# Patient Record
Sex: Female | Born: 1990 | Race: Black or African American | Hispanic: No | Marital: Single | Smoking: Never smoker
Health system: Southern US, Community
[De-identification: ages and names within clinical notes are randomized; demographics above are authoritative.]

## PROBLEM LIST (undated history)

## (undated) DIAGNOSIS — Z789 Other specified health status: Secondary | ICD-10-CM

## (undated) DIAGNOSIS — J939 Pneumothorax, unspecified: Secondary | ICD-10-CM

## (undated) HISTORY — PX: NO PAST SURGERIES: SHX2092

---
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
## 2008-06-30 ENCOUNTER — Emergency Department (HOSPITAL_COMMUNITY): Admission: EM | Admit: 2008-06-30 | Discharge: 2008-07-01 | Payer: Self-pay | Admitting: Emergency Medicine

## 2009-01-07 ENCOUNTER — Emergency Department (HOSPITAL_COMMUNITY): Admission: EM | Admit: 2009-01-07 | Discharge: 2009-01-07 | Payer: Self-pay | Admitting: Emergency Medicine

## 2009-03-04 ENCOUNTER — Ambulatory Visit: Payer: Self-pay | Admitting: Internal Medicine

## 2009-03-04 ENCOUNTER — Inpatient Hospital Stay (HOSPITAL_COMMUNITY): Admission: EM | Admit: 2009-03-04 | Discharge: 2009-03-06 | Payer: Self-pay | Admitting: Emergency Medicine

## 2009-07-12 ENCOUNTER — Emergency Department (HOSPITAL_COMMUNITY): Admission: EM | Admit: 2009-07-12 | Discharge: 2009-07-12 | Payer: Self-pay | Admitting: Emergency Medicine

## 2009-12-07 ENCOUNTER — Emergency Department (HOSPITAL_COMMUNITY): Admission: EM | Admit: 2009-12-07 | Discharge: 2009-12-07 | Payer: Self-pay | Admitting: Emergency Medicine

## 2010-01-07 ENCOUNTER — Emergency Department (HOSPITAL_COMMUNITY): Admission: EM | Admit: 2010-01-07 | Discharge: 2010-01-07 | Payer: Self-pay | Admitting: Emergency Medicine

## 2010-04-24 ENCOUNTER — Emergency Department (HOSPITAL_COMMUNITY): Admission: EM | Admit: 2010-04-24 | Discharge: 2010-04-25 | Payer: Self-pay | Admitting: Emergency Medicine

## 2010-10-05 ENCOUNTER — Emergency Department (HOSPITAL_COMMUNITY)
Admission: EM | Admit: 2010-10-05 | Discharge: 2010-10-05 | Payer: Self-pay | Source: Home / Self Care | Admitting: Emergency Medicine

## 2010-10-13 ENCOUNTER — Emergency Department (HOSPITAL_COMMUNITY)
Admission: EM | Admit: 2010-10-13 | Discharge: 2010-10-13 | Payer: Self-pay | Source: Home / Self Care | Admitting: Emergency Medicine

## 2010-11-06 ENCOUNTER — Encounter: Payer: Self-pay | Admitting: Family Medicine

## 2010-12-26 ENCOUNTER — Emergency Department (HOSPITAL_COMMUNITY)
Admission: EM | Admit: 2010-12-26 | Discharge: 2010-12-26 | Disposition: A | Discharge status: Home / Self Care | Payer: Self-pay | Attending: Emergency Medicine | Admitting: Emergency Medicine

## 2010-12-26 ENCOUNTER — Emergency Department (HOSPITAL_COMMUNITY): Payer: Self-pay

## 2010-12-26 DIAGNOSIS — R079 Chest pain, unspecified: Secondary | ICD-10-CM | POA: Insufficient documentation

## 2010-12-26 DIAGNOSIS — R112 Nausea with vomiting, unspecified: Secondary | ICD-10-CM | POA: Insufficient documentation

## 2010-12-26 DIAGNOSIS — R5381 Other malaise: Secondary | ICD-10-CM | POA: Insufficient documentation

## 2010-12-26 DIAGNOSIS — R109 Unspecified abdominal pain: Secondary | ICD-10-CM | POA: Insufficient documentation

## 2010-12-26 DIAGNOSIS — E860 Dehydration: Secondary | ICD-10-CM | POA: Insufficient documentation

## 2010-12-26 LAB — URINALYSIS, ROUTINE W REFLEX MICROSCOPIC
Glucose, UA: NEGATIVE mg/dL
Ketones, ur: >80 mg/dL — AB
Nitrite: NEGATIVE
Protein, ur: 100 mg/dL — AB
Specific Gravity, Urine: 1.038 — ABNORMAL HIGH (ref 1.005–1.030)
Urobilinogen, UA: 1 mg/dL (ref 0.0–1.0)
pH: 6 (ref 5.0–8.0)

## 2010-12-26 LAB — DIFFERENTIAL
Basophils Absolute: 0 10*3/uL (ref 0.0–0.1)
Basophils Relative: 0 % (ref 0–1)
Eosinophils Absolute: 0 10*3/uL (ref 0.0–0.7)
Eosinophils Relative: 0 % (ref 0–5)
Lymphocytes Relative: 21 % (ref 12–46)
Lymphs Abs: 1.4 10*3/uL (ref 0.7–4.0)
Monocytes Absolute: 0.5 10*3/uL (ref 0.1–1.0)
Monocytes Relative: 7 % (ref 3–12)
Neutro Abs: 5 10*3/uL (ref 1.7–7.7)
Neutrophils Relative %: 72 % (ref 43–77)

## 2010-12-26 LAB — COMPREHENSIVE METABOLIC PANEL
ALT: 15 U/L (ref 0–35)
AST: 26 U/L (ref 0–37)
Albumin: 4 g/dL (ref 3.5–5.2)
Alkaline Phosphatase: 41 U/L (ref 39–117)
BUN: 21 mg/dL (ref 6–23)
CO2: 25 mEq/L (ref 19–32)
Calcium: 8.6 mg/dL (ref 8.4–10.5)
Chloride: 108 mEq/L (ref 96–112)
Creatinine, Ser: 0.89 mg/dL (ref 0.4–1.2)
GFR calc Af Amer: >60 mL/min (ref >60)
GFR calc non Af Amer: >60 mL/min (ref >60)
Glucose, Bld: 87 mg/dL (ref 70–99)
Potassium: 3.6 mEq/L (ref 3.5–5.1)
Sodium: 140 mEq/L (ref 135–145)
Total Bilirubin: 1.1 mg/dL (ref 0.3–1.2)
Total Protein: 6.7 g/dL (ref 6.0–8.3)

## 2010-12-26 LAB — LIPASE, BLOOD: Lipase: 23 U/L (ref 11–59)

## 2010-12-26 LAB — CBC
HCT: 40.4 % (ref 36.0–46.0)
Hemoglobin: 13.3 g/dL (ref 12.0–15.0)
MCH: 30.8 pg (ref 26.0–34.0)
MCHC: 32.9 g/dL (ref 30.0–36.0)
MCV: 93.5 fL (ref 78.0–100.0)
Platelets: 229 10*3/uL (ref 150–400)
RBC: 4.32 MIL/uL (ref 3.87–5.11)
RDW: 13.1 % (ref 11.5–15.5)
WBC: 6.9 10*3/uL (ref 4.0–10.5)

## 2010-12-26 LAB — POCT PREGNANCY, URINE: Preg Test, Ur: NEGATIVE

## 2010-12-26 LAB — URINE MICROSCOPIC-ADD ON

## 2010-12-27 LAB — URINALYSIS, ROUTINE W REFLEX MICROSCOPIC
Bilirubin Urine: NEGATIVE
Glucose, UA: NEGATIVE mg/dL
Glucose, UA: NEGATIVE mg/dL
Ketones, ur: NEGATIVE mg/dL
Ketones, ur: 40 mg/dL — AB
Leukocytes, UA: NEGATIVE
Nitrite: NEGATIVE
Protein, ur: NEGATIVE mg/dL
Specific Gravity, Urine: 1.018 (ref 1.005–1.030)
Urobilinogen, UA: 0.2 mg/dL (ref 0.0–1.0)
pH: 8 (ref 5.0–8.0)
pH: 5 (ref 5.0–8.0)

## 2010-12-27 LAB — POCT PREGNANCY, URINE
Preg Test, Ur: NEGATIVE
Preg Test, Ur: NEGATIVE

## 2010-12-27 LAB — BASIC METABOLIC PANEL
BUN: 19 mg/dL (ref 6–23)
CO2: 23 mEq/L (ref 19–32)
Chloride: 105 mEq/L (ref 96–112)
Creatinine, Ser: 0.9 mg/dL (ref 0.4–1.2)

## 2010-12-27 LAB — URINE MICROSCOPIC-ADD ON

## 2010-12-27 LAB — CBC
MCH: 31.9 pg (ref 26.0–34.0)
MCV: 90.9 fL (ref 78.0–100.0)
Platelets: 314 10*3/uL (ref 150–400)
RDW: 12.3 % (ref 11.5–15.5)

## 2010-12-27 LAB — WET PREP, GENITAL: Yeast Wet Prep HPF POC: NONE SEEN

## 2010-12-27 LAB — DIFFERENTIAL
Basophils Absolute: 0 10*3/uL (ref 0.0–0.1)
Eosinophils Absolute: 0 10*3/uL (ref 0.0–0.7)
Eosinophils Relative: 0 % (ref 0–5)
Lymphs Abs: 1.4 10*3/uL (ref 0.7–4.0)

## 2010-12-27 LAB — URINE CULTURE

## 2010-12-27 LAB — GC/CHLAMYDIA PROBE AMP, GENITAL: GC Probe Amp, Genital: NEGATIVE

## 2011-01-02 LAB — BASIC METABOLIC PANEL
GFR calc Af Amer: >60 mL/min (ref >60)
GFR calc non Af Amer: >60 mL/min (ref >60)
Glucose, Bld: 144 mg/dL — ABNORMAL HIGH (ref 70–99)
Potassium: 3.6 mEq/L (ref 3.5–5.1)
Sodium: 139 mEq/L (ref 135–145)

## 2011-01-02 LAB — URINE MICROSCOPIC-ADD ON

## 2011-01-02 LAB — URINALYSIS, ROUTINE W REFLEX MICROSCOPIC
Bilirubin Urine: NEGATIVE
Glucose, UA: NEGATIVE mg/dL
Ketones, ur: 40 mg/dL — AB
Protein, ur: 100 mg/dL — AB

## 2011-01-02 LAB — DIFFERENTIAL
Lymphocytes Relative: 5 % — ABNORMAL LOW (ref 12–46)
Lymphs Abs: 0.6 10*3/uL — ABNORMAL LOW (ref 0.7–4.0)
Monocytes Absolute: 0.1 10*3/uL (ref 0.1–1.0)
Monocytes Relative: 1 % — ABNORMAL LOW (ref 3–12)
Neutro Abs: 12.6 10*3/uL — ABNORMAL HIGH (ref 1.7–7.7)
Neutrophils Relative %: 94 % — ABNORMAL HIGH (ref 43–77)

## 2011-01-02 LAB — CBC
HCT: 44 % (ref 36.0–46.0)
Hemoglobin: 14.6 g/dL (ref 12.0–15.0)
MCHC: 33.1 g/dL (ref 30.0–36.0)
RBC: 4.63 MIL/uL (ref 3.87–5.11)
WBC: 13.4 10*3/uL — ABNORMAL HIGH (ref 4.0–10.5)

## 2011-01-09 LAB — URINALYSIS, ROUTINE W REFLEX MICROSCOPIC
Glucose, UA: NEGATIVE mg/dL
Specific Gravity, Urine: >1.03 — ABNORMAL HIGH (ref 1.005–1.030)

## 2011-01-09 LAB — POCT I-STAT, CHEM 8
BUN: 21 mg/dL (ref 6–23)
Calcium, Ion: 0.86 mmol/L — ABNORMAL LOW (ref 1.12–1.32)
Glucose, Bld: 104 mg/dL — ABNORMAL HIGH (ref 70–99)
HCT: 40 % (ref 36.0–46.0)
TCO2: 13 mmol/L (ref 0–100)

## 2011-01-09 LAB — POCT PREGNANCY, URINE: Preg Test, Ur: NEGATIVE

## 2011-01-09 LAB — URINE MICROSCOPIC-ADD ON

## 2011-01-21 LAB — URINALYSIS, ROUTINE W REFLEX MICROSCOPIC
Hgb urine dipstick: NEGATIVE
Ketones, ur: 40 mg/dL — AB
Nitrite: NEGATIVE
Specific Gravity, Urine: 1.031 — ABNORMAL HIGH (ref 1.005–1.030)
pH: 7 (ref 5.0–8.0)

## 2011-01-21 LAB — POCT PREGNANCY, URINE: Preg Test, Ur: NEGATIVE

## 2011-01-21 LAB — URINE MICROSCOPIC-ADD ON

## 2011-01-25 LAB — BASIC METABOLIC PANEL
BUN: 4 mg/dL — ABNORMAL LOW (ref 6–23)
CO2: 24 mEq/L (ref 19–32)
Calcium: 8.7 mg/dL (ref 8.4–10.5)
Creatinine, Ser: 0.78 mg/dL (ref 0.4–1.2)
GFR calc Af Amer: >60 mL/min (ref >60)
GFR calc non Af Amer: >60 mL/min (ref >60)
Potassium: 4.1 mEq/L (ref 3.5–5.1)

## 2011-01-25 LAB — CBC
HCT: 32.1 % — ABNORMAL LOW (ref 36.0–46.0)
MCHC: 32.7 g/dL (ref 30.0–36.0)
MCHC: 34 g/dL (ref 30.0–36.0)
Platelets: 232 10*3/uL (ref 150–400)
RBC: 3.95 MIL/uL (ref 3.87–5.11)
RBC: 4.88 MIL/uL (ref 3.87–5.11)
WBC: 7.3 10*3/uL (ref 4.0–10.5)
WBC: 9.3 10*3/uL (ref 4.0–10.5)

## 2011-01-25 LAB — URINE MICROSCOPIC-ADD ON

## 2011-01-25 LAB — COMPREHENSIVE METABOLIC PANEL
ALT: 20 U/L (ref 0–35)
AST: 29 U/L (ref 0–37)
Alkaline Phosphatase: 70 U/L (ref 39–117)
CO2: 25 mEq/L (ref 19–32)
Calcium: 10.7 mg/dL — ABNORMAL HIGH (ref 8.4–10.5)
GFR calc Af Amer: >60 mL/min (ref >60)
GFR calc non Af Amer: >60 mL/min (ref >60)
Potassium: 3 mEq/L — ABNORMAL LOW (ref 3.5–5.1)
Sodium: 139 mEq/L (ref 135–145)
Total Protein: 8.3 g/dL (ref 6.0–8.3)

## 2011-01-25 LAB — URINALYSIS, ROUTINE W REFLEX MICROSCOPIC
Specific Gravity, Urine: 1.034 — ABNORMAL HIGH (ref 1.005–1.030)
pH: 6.5 (ref 5.0–8.0)

## 2011-01-25 LAB — TSH: TSH: 3.253 u[IU]/mL (ref 0.350–4.500)

## 2011-01-25 LAB — DIFFERENTIAL
Eosinophils Absolute: 0 10*3/uL (ref 0.0–0.7)
Eosinophils Relative: 0 % (ref 0–5)
Lymphs Abs: 1.7 10*3/uL (ref 0.7–4.0)
Monocytes Relative: 5 % (ref 3–12)

## 2011-01-25 LAB — MAGNESIUM: Magnesium: 2.4 mg/dL (ref 1.5–2.5)

## 2011-01-27 LAB — COMPREHENSIVE METABOLIC PANEL
ALT: 29 U/L (ref 0–35)
Alkaline Phosphatase: 70 U/L (ref 39–117)
CO2: 19 mEq/L (ref 19–32)
Chloride: 103 mEq/L (ref 96–112)
GFR calc non Af Amer: >60 mL/min (ref >60)
Glucose, Bld: 92 mg/dL (ref 70–99)
Potassium: 3.1 mEq/L — ABNORMAL LOW (ref 3.5–5.1)
Sodium: 138 mEq/L (ref 135–145)
Total Bilirubin: 2.6 mg/dL — ABNORMAL HIGH (ref 0.3–1.2)

## 2011-01-27 LAB — WET PREP, GENITAL
Trich, Wet Prep: NONE SEEN
Yeast Wet Prep HPF POC: NONE SEEN

## 2011-01-27 LAB — URINALYSIS, ROUTINE W REFLEX MICROSCOPIC
Ketones, ur: >80 mg/dL — AB
Nitrite: NEGATIVE
Protein, ur: 100 mg/dL — AB

## 2011-01-27 LAB — DIFFERENTIAL
Basophils Relative: 0 % (ref 0–1)
Eosinophils Absolute: 0 10*3/uL (ref 0.0–0.7)
Neutrophils Relative %: 86 % — ABNORMAL HIGH (ref 43–77)

## 2011-01-27 LAB — URINE MICROSCOPIC-ADD ON

## 2011-01-27 LAB — POCT PREGNANCY, URINE: Preg Test, Ur: NEGATIVE

## 2011-01-27 LAB — LIPASE, BLOOD: Lipase: 18 U/L (ref 11–59)

## 2011-01-27 LAB — CBC
Hemoglobin: 14.8 g/dL (ref 12.0–15.0)
MCHC: 33.8 g/dL (ref 30.0–36.0)
RBC: 4.64 MIL/uL (ref 3.87–5.11)
WBC: 6.5 10*3/uL (ref 4.0–10.5)

## 2011-01-27 LAB — GC/CHLAMYDIA PROBE AMP, GENITAL: Chlamydia, DNA Probe: NEGATIVE

## 2011-02-17 ENCOUNTER — Emergency Department (HOSPITAL_COMMUNITY)
Admission: EM | Admit: 2011-02-17 | Discharge: 2011-02-17 | Disposition: A | Discharge status: Home / Self Care | Payer: Self-pay | Attending: Emergency Medicine | Admitting: Emergency Medicine

## 2011-02-17 DIAGNOSIS — R112 Nausea with vomiting, unspecified: Secondary | ICD-10-CM | POA: Insufficient documentation

## 2011-02-17 DIAGNOSIS — R109 Unspecified abdominal pain: Secondary | ICD-10-CM | POA: Insufficient documentation

## 2011-02-17 DIAGNOSIS — E860 Dehydration: Secondary | ICD-10-CM | POA: Insufficient documentation

## 2011-02-17 LAB — URINE MICROSCOPIC-ADD ON

## 2011-02-17 LAB — URINALYSIS, ROUTINE W REFLEX MICROSCOPIC
Glucose, UA: NEGATIVE mg/dL
Specific Gravity, Urine: 1.036 — ABNORMAL HIGH (ref 1.005–1.030)
Urobilinogen, UA: 1 mg/dL (ref 0.0–1.0)

## 2011-03-01 NOTE — Discharge Summary (Signed)
NAMESAIMA, [name]            ACCOUNT NO.:  [id number]   MEDICAL RECORD NO.:  [id number]          PATIENT TYPE:  INP   LOCATION:  1518                         FACILITY:  [facility]   PHYSICIAN:  Elliot Cousin, M.D.    DATE OF BIRTH:  07/24/1991   DATE OF ADMISSION:  03/04/2009  DATE OF DISCHARGE:  03/06/2009                               DISCHARGE SUMMARY   DISCHARGE DIAGNOSES:  1. Moderate pneumomediastinum.  The etiology was felt to be      spontaneous as a consequence of persistent intractable vomiting      associated with Valsalva.  2. Associated tiny left apex pneumothorax, completely resolved prior      to hospital discharge.  3. Severe dysmenorrhea with nausea, vomiting, and diarrhea associated      with menstrual periods.  4. Hypokalemia.  5. Hematuria associated with menstrual blood loss.  6. Facial angioedema though to be secondary to vancomycin.   DISCHARGE MEDICATIONS:  1. Vicodin 5 mg every six hours as needed for pain.  2. Phenergan 12.5 mg every six hours as needed for nausea.   DISCHARGE DISPOSITION:  The patient is being discharged to home in  improved and stable condition.  She was advised to follow up at the  [facility] to see her new primary care physician, Dr. Audria Nine  on April 07, 2009 at 2:30 p.m..   CONSULTATIONS:  Sandrea Hughs, MD.   PROCEDURES PERFORMED:  1. Chest x-ray in Mar 06, 2009.  The results revealed decreased      pneumomediastinum.  2. Chest x-ray on Mar 05, 2009.  The results revealed no change in      pneumomediastinum.  3. CT angiogram of the chest on Mar 05, 2009.  Results revealed no      evidence of acute pulmonary embolism.  Moderate pneumomediastinum,      slightly progressive compared with chest radiograph yesterday.  No      pneumothorax.  4. Chest x-ray on Mar 04, 2009.  The results revealed      pneumomediastinum with tiny left apex pneumothorax.   HISTORY OF PRESENT ILLNESS:  The patient is a 20 year old woman with  a  past medical history significant for severe dysmenorrhea, who presented  to the emergency department on Mar 04, 2009 with a chief complaint of  sharp substernal chest pain.  The patient actually presented to the  emergency department a few days prior because of the same symptoms.  When she was evaluated in the emergency department on May 19, she was  noted to be afebrile and hemodynamically stable.  A chest x-ray was  ordered and it revealed pneumomediastinum with a tiny left apex  pneumothorax.  Of note, the patient gave a history of abdominal pain,  nausea, vomiting and diarrhea several days prior to the onset of her  chest pain.  According to the patient, she had these recurrent symptoms  monthly associated with her menstrual periods.  Given the finding of the  pneumomediastinum, she was admitted for further evaluation and  management.   For further details, please see the dictated history and physical.  HOSPITAL COURSE:  1. PNEUMOMEDIASTINUM AND TINY PNEUMOTHORAX.  A CT scan of the chest      was ordered for further evaluation.  The CT angiogram revealed      moderate pneumomediastinum, no evidence of acute pulmonary      embolism, and no pneumothorax.  The patient was admitted to the      intensive care unit for closer observation.  Oxygen therapy was      administered.  Her pain was treated with as-needed Dilaudid.      Apparently the patient was given multiple antibiotics in the      emergency department including Zithromax, Unasyn, and vancomycin.      She was subsequently started on Avelox.  It was unclear why these      antibiotics were ordered as the patient had no evidence of fever or      infection.  They were discontinued.  Pulmonologist Dr. Sherene Sires was      consulted for his assessment.  He commented that it was not unusual      for young individuals to experience a spontaneous pneumothorax or      pneumomediastinum, particularly if it is associated with Valsalva-       like actions such as retching and vomiting as the patient      experienced.  He concurred with oxygen therapy and recommended      adding as needed nebulizers for symptomatic relief.  He concurred      as well with obtaining serial chest x-rays until there was both      radiographic and clinical improvement.  The follow-up chest x-ray      on May 20 revealed no change in the pneumomediastinum.  However,      the chest x-ray today revealed a decrease in the pneumomediastinum.      There was no evidence of a pneumothorax.  The patient is      symptomatically improved and she has no complaints of shortness of      breath or chest pain.  2. SEVERE DYSMENORRHEA (nausea, vomiting, abdominal pain and diarrhea      associated with menstrual periods).  The patient's symptoms had      mostly resolved at the time of the initial hospital assessment.      However, because it was felt that her recurrent symptoms were the      impetus for the pneumomediastinum, she was strongly advised to      follow up with either the [facility] or [facility] for ongoing management.  An appointment has      been made for her to follow up with Dr. Audria Nine at the      [facility] in a few weeks.  Of note, a urinalysis was      ordered and it revealed large blood, 100 protein, negative      nitrites, and negative leukocytes.  The blood seen in the urine was      felt to be secondary to menstrual blood.  The patient had no      complaints of painful urination.  3. NORMOCYTIC ANEMIA.  The patient's hemoglobin fell to 10.8.  This      was felt to be secondary to menstrual bleeding.  Her white blood      cell count at the time of the initial hospital assessment was      elevated at 15.1 which was  thought to be due to hemoconcentration;      it normalized.  4. HYPOKALEMIA.  The patient's serum potassium was 3 at the time of      the initial hospital assessment.  She was  repleted with potassium      chloride orally and in the IV fluids.  Blood magnesium was assessed      and was within normal limits at 2.4.  Today, her serum potassium is      4.1.  5. ALLERGIC REACTION TO VANCOMYCIN SUSPECTED.  The patient apparently      developed facial angioedema shortly after the vancomycin infusion.      It was stopped and she was treated with Benadryl.  Her symptoms      completely resolved.  Her symptoms were likely secondary to      vancomycin although the patient did receive multiple antibiotics in      the emergency department which were all subsequently discontinued.      The patient was informed of her potential allergy to vancomycin.      She voiced understanding.   Additional lab studies were ordered including a TSH which was within  normal limits at 3.253 and a urine pregnancy test which was negative.      Elliot Cousin, M.D.  Electronically Signed     DF/MEDQ  D:  03/06/2009  T:  03/06/2009  Job:  [id number]   cc:   Maurice March, M.D.  Fax: [phone]

## 2011-03-01 NOTE — Discharge Summary (Signed)
[name], [name]            ACCOUNT NO.:  [id number]   MEDICAL RECORD NO.:  [id number]          PATIENT TYPE:  INP   LOCATION:  6121                         FACILITY:  [facility]   PHYSICIAN:  Orie Rout, M.D.DATE OF BIRTH:  11-14-1990   DATE OF ADMISSION:  04/16/2007  DATE OF DISCHARGE:  04/21/2007                               DISCHARGE SUMMARY   REASON FOR HOSPITALIZATION:  Dysmenorrhea, continued emesis in a 20-year-  old African-American female who denies any sexual activity.   SIGNIFICANT FINDINGS:  Admission labs consisted of a normal complete  metabolic panel.  CBC showed white blood cells of 11, hemoglobin of  14.1, hematocrit of 42, and platelets of 310, with a differential of 91%  neutrophils.  Urinalysis was significant only for greater than 80  ketones and positive granular casts.  Urine pregnancy test was negative.  Wet prep was positive for a few clue cells, negative for yeast, negative  for trichomonads, negative for white blood cells.  Ultrasound of the  abdomen and pelvis were normal showing no free fluid.  Of note, the  patient's GC chlamydia test were positive for GC.   On exam in the emergency department, the patient showed some mild  cervical motion tenderness on exam.  Also during this admission, the  patient had an EKG performed which revealed a prolonged QTC which was  approximately 0.5 seconds.   TREATMENT:  The patient received ceftriaxone intramuscularly x1 in the  emergency department to treat cervicitis that was noted on exam in the  emergency department.  Was also started on doxycycline.  Supportive  treatment with IV maintenance IV fluids was begun along with antiemetics  for nausea.  As previously stated, doxycycline 100 mg twice a day was  started in the ED for a total of a 27-dose course.  The date of this  start was April 17, 2007.   OPERATION/PROCEDURE:  None.   FINAL DIAGNOSES:  1. Dysmenorrhea.  2. Premenstrual dysphoric  disorder.  3. Gonorrhea infection.  4. Pelvic inflammatory disease.  5. Long QT syndrome.  6. Persistent emesis.   DISCHARGE MEDICATIONS AND INSTRUCTIONS:  1. Doxycycline 100 mg p.o. b.i.d. x20 doses to complete a 27-dose      course.  2. Phenergan 12.5 mg p.o. every four to six hours p.r.n. nausea and      vomiting.  3. Oral contraceptive pill Loestrin 24 Fe 1 mg/20 mcg p.o. once daily.   The patient is to return for temperature greater than 100.4, or if the  nausea and vomiting becomes worse.   PENDING RESULTS AND ISSUES TO BE FOLLOWED:  Long QT syndrome;  premenstrual dysphoric disorder.   FOLLOWUP:  Follow up is with Dr. Johney Maine, phone number [phone].  Scheduled date for follow up is April 26, 2007 at 3 p.m.  The patient is  also to follow up with Dr. Ace Gins with cardiology on May 10, 2007 at 9:30  a.m.   Discharge weight 45 kilograms.   Discharge condition improved.   Please fax a copy of this transcription to Dr. Johney Maine with [facility].  Fax  number is [phone].  Also, please fax a copy  of this transcription to Dr. Ace Gins with cardiology.  Fax number is [phone].      Myrtie Soman, MD  Electronically Signed      Orie Rout, M.D.  Electronically Signed    TE/MEDQ  D:  04/21/2007  T:  04/21/2007  Job:  [id number]

## 2011-03-01 NOTE — H&P (Signed)
[name], [name]            ACCOUNT NO.:  [id number]   MEDICAL RECORD NO.:  [id number]          PATIENT TYPE:  INP   LOCATION:  1223                         FACILITY:  [facility]   PHYSICIAN:  Richarda Overlie, MD       DATE OF BIRTH:  [birth year]   DATE OF ADMISSION:  03/04/2009  DATE OF DISCHARGE:                              HISTORY & PHYSICAL   PRIMARY CARE PHYSICIAN:  Unassigned.   CHIEF COMPLAINT:  Chest pain.   SUBJECTIVE:  This is a 20 year old female who presented to the ER with  chief complaint of chest discomfort that started about 4 days ago,  fairly acute in onset.  The chest pain is described as episodic, worse  with exertion.  The patient describes the chest pain to be substernal in  location and sharp in nature, not pleuritic.  Worse when she coughs.  The patient has been having some URI like symptoms with nasal  congestion, sore throat and a cough for the last 7-10 days.  She denies  any fever, chills or rigors.  The patient has not had any treatment thus  far for her cardiopulmonary symptoms.  She denies any history of  smoking.   PAST MEDICAL HISTORY:  None.   PAST SURGICAL HISTORY:  None.   SOCIAL HISTORY:  Nonsmoker and nonalcoholic.  No history of drug abuse.   ALLERGIES:  NO KNOWN DRUG ALLERGIES.   HOME MEDICATIONS:  None.   REVIEW OF SYSTEMS:  As documented in HPI.   PHYSICAL EXAMINATION:  VITAL SIGNS:  Blood pressure 154/80, pulse of 82,  respirations 20, temperature 97.7.  GENERAL:  The patient appears to be currently comfortable in no acute  distress.  HEENT:  Pupils equal and reactive.  Extraocular movements intact.  Sclerae is anicteric.  Head is atraumatic, normocephalic.  LUNGS:  Bilateral breath sounds are equal.  No rhonchi, no wheezing, no  rubs.  CARDIOVASCULAR:  Regular rate and rhythm.  No appreciable  murmurs, rubs or gallops.  ABDOMEN:  Soft, nontender, nondistended.  EXTREMITIES:  Without cyanosis, clubbing or edema.  NEUROLOGIC:   Cranial nerves II-XII appear to be grossly intact.  The  patient is alert and oriented x3.  SKIN:  No rash, no petechiae.   LABORATORY DATA:  EKG shows sinus arrhythmia with premature ventricular  contractions with a rate of 64 beats per minute, a normal axis, a  prolonged QT.   Chest.  A pneumomediastinum is seen with tiny left apex, pneumothorax  with tiny left apical pneumothorax.  Urinalysis shows a cloudy  appearance with a specific gravity of 1.034.  Hemoglobin, large amount  of hemoglobin, small amount of ketones at 15.  Negative for nitrite and  leukocyte esterase.  Pregnancy test is negative.  Lipase is 19.  Comprehensive metabolic panel shows a sodium of 139, potassium 3.0,  chloride 102, bicarb 25, glucose 116, BUN 20, creatinine 0.89, calcium  10.7.  AST, ALT, alk phos, total bili within normal limits.  WBC 8.8,  hemoglobin 15.1, hematocrit 46.0, MCV 94.3, platelet count of 336.  Differential, no left shift.   ASSESSMENT AND PLAN:  Pneumomediastinum, likely spontaneous because of  recent upper respiratory infection like symptoms.   I discussed the case with Dr. Maurice March and Dr. Shelle Iron.  Dr. Maurice March is a  pulmonary critical care specialist on-call tonight and Dr. Shelle Iron is a  pulmonologist on-call tonight and the hospitalist service has been  instructed to admit this patient to the inpatient medical service.  We  will monitor the patient in the step-down unit.  Continue her or  humidified oxygen, start her on nebulizer treatments and Pulmicort nebs.  Replete her potassium, repeat a chest x-ray PA and lateral in the  morning.  We will probably get a thoracic surgery consult in the  morning.  Pain control will be optimized with p.r.n. Dilaudid.  The  patient will be started empirically on Avelox and vancomycin for broad-  spectrum coverage, Robitussin with codeine for cough.  She is a full  code.      Richarda Overlie, MD  Electronically Signed     NA/MEDQ  D:  03/04/2009  T:   03/05/2009  Job:  [id number]

## 2011-03-04 NOTE — Discharge Summary (Signed)
[name], [name]            ACCOUNT NO.:  [id number]   MEDICAL RECORD NO.:  [id number]          PATIENT TYPE:  OBV   LOCATION:  6150                         FACILITY:  [facility]   PHYSICIAN:  Pediatrics Resident    DATE OF BIRTH:  27-Jun-1991   DATE OF ADMISSION:  12/26/2006  DATE OF DISCHARGE:  12/28/2006                               DISCHARGE SUMMARY   REASON FOR HOSPITALIZATION:  Vomiting, diarrhea, and chest pain.   SIGNIFICANT FINDINGS:  This is a 20 year old female with nausea,  vomiting, and diarrhea who was noted to have mild dehydration on  admission.  The vomiting occurs with her menstrual cycle.  Recent pelvic  examination done at a prior ED visit showed negative GC/Chlamydia and  normal pelvic examination.   PERTINENT LABORATORIES:  Normal sodium 136, normal potassium 3.7, normal  chloride 101, normal bicarbonate 22, normal BUN 23, normal creatinine  0.7, glucose of 100.  GGT normal 23, calcium 9.8, phosphorous is low at  1.6, magnesium 2.2, CRP 0, ESR 5, calcium 9.8.  UDS:  Urine drug screen  positive for marijuana.  Flu negative.  Lipase 28, amylase slightly  elevated at 136, AST normal 38, ALT normal 16.  Urine pregnancy  negative.  Urinalysis showed specific gravity of 1.034, small bilirubin,  40 ketones, large amount of blood, 100 of protein, and small leukocytes.  Chest x-ray was within normal limits, and EKG showed a prolonged QT and  QTC at 0.5.  Ultrasound of the abdomen was normal.   On the day of discharge, please note the child did not have any  abdominal pain and was well appearing, well hydrated.   TREATMENT:  IV fluid, Zofran, Nexium, Tylenol.  We also got a  psychiatric consultation from Dr. Lindie Spruce.   OPERATIONS AND PROCEDURES:  Abdominal ultrasound which was normal and a  chest x-ray.   FINAL DIAGNOSES:  1. Premenstrual dysphoric disorder.  2. Diarrhea and vomiting secondary to increased hormonal surges.  3. Prolonged QT/QTC syndrome.   DISCHARGE MEDICATIONS AND INSTRUCTIONS:  No medications, however, the  patient was instructed not to exercise vigorously or play sports until  she sees Dr. Karn Pickler at [facility].   RESULTS AND ISSUES TO BE FOLLOWED:  None except for a repeat EKG at her  next office visit.   FOLLOWUP:  Followup March 19 at 1:30 p.m. with Dr. Karn Pickler at [facility].   DISCHARGE WEIGHT:  4 kg.   DISCHARGE CONDITION:  Improved.           ______________________________  Pediatrics Resident     PR/MEDQ  D:  12/28/2006  T:  12/28/2006  Job:  [id number]   cc:   Johney Maine, M.D.

## 2011-03-04 NOTE — Discharge Summary (Signed)
[name], [name]            ACCOUNT NO.:  [id number]   MEDICAL RECORD NO.:  [id number]          PATIENT TYPE:  INP   LOCATION:  6121                         FACILITY:  [facility]   PHYSICIAN:  Pediatrics Resident    DATE OF BIRTH:  12-28-1990   DATE OF ADMISSION:  04/16/2007  DATE OF DISCHARGE:  04/21/2007                               DISCHARGE SUMMARY   REASON FOR HOSPITALIZATION:  Dysmenorrhea and continued emesis in a 20-  year-old African-American female who denies sexual activity.   SIGNIFICANT FINDINGS:  Admit labs are within normal limits.  CMP and CBC  normal.  White count 11.0, H&H 14.1 and 42, platelets 310, and 91%  neutrophils.  UA significant only for greater than 88 ketones, positive  granular casts, and urine pregnancy negative.  Wet prep positive for few  clue cells, no yeast, no Trichomonas, no white blood cells.  Ultrasound  of abdomen, the pelvis normal, no free fluid.  GC positive.  Cervical  motion tenderness on ED pelvic exam.  Prolonged QTC on EKG, greater than  0.5 seconds.   TREATMENT:  Received ceftriaxone IM x1 and doxycycline x1 to treat  cervicitis per ED's part of treatment with maintenance IV fluids,  antiemetics, doxycycline 100 mg b.i.d., 27-day course began on July 1.   FINAL DIAGNOSES:  1. Dysmenorrhea.  2. Premenstrual dysphoric disorder.  3. Gonorrhea infection.  4. Pelvic inflammatory disease.  5. Long QTC syndrome.  6. Persistent emesis.   DISCHARGE MEDICATIONS:  1. Doxycycline 100 p.o. b.i.d. x20 doses.  2. Phenergan 12.5 mg p.o. q.4h. p.r.n. nausea, vomiting.  3. Loestrin 24 Fe 1/20 mcg p.o. once daily.  4. Return if temp greater than 101.4, nausea or vomiting becomes      worse.   ISSUES TO BE FOLLOWED:  Long QT and PMD followup, Dr. Johney Maine.   DISCHARGE WEIGHT:  45 kg.   DISCHARGE CONDITION:  Improved.      Pediatrics Resident     PR/MEDQ  D:  06/03/2007  T:  06/03/2007  Job:  [id number]

## 2011-06-05 ENCOUNTER — Emergency Department (HOSPITAL_COMMUNITY): Payer: Self-pay

## 2011-06-05 ENCOUNTER — Emergency Department (HOSPITAL_COMMUNITY)
Admission: EM | Admit: 2011-06-05 | Discharge: 2011-06-05 | Disposition: A | Discharge status: Home / Self Care | Payer: Self-pay | Attending: Emergency Medicine | Admitting: Emergency Medicine

## 2011-06-05 DIAGNOSIS — N946 Dysmenorrhea, unspecified: Secondary | ICD-10-CM | POA: Insufficient documentation

## 2011-06-05 DIAGNOSIS — R5383 Other fatigue: Secondary | ICD-10-CM | POA: Insufficient documentation

## 2011-06-05 DIAGNOSIS — R1110 Vomiting, unspecified: Secondary | ICD-10-CM | POA: Insufficient documentation

## 2011-06-05 DIAGNOSIS — R079 Chest pain, unspecified: Secondary | ICD-10-CM | POA: Insufficient documentation

## 2011-06-05 DIAGNOSIS — R5381 Other malaise: Secondary | ICD-10-CM | POA: Insufficient documentation

## 2011-06-05 LAB — DIFFERENTIAL
Lymphocytes Relative: 17 % (ref 12–46)
Lymphs Abs: 1.4 10*3/uL (ref 0.7–4.0)
Neutro Abs: 6.7 10*3/uL (ref 1.7–7.7)
Neutrophils Relative %: 79 % — ABNORMAL HIGH (ref 43–77)

## 2011-06-05 LAB — CBC
HCT: 40.7 % (ref 36.0–46.0)
Hemoglobin: 14.8 g/dL (ref 12.0–15.0)
MCV: 89.5 fL (ref 78.0–100.0)
Platelets: 307 10*3/uL (ref 150–400)
RBC: 4.55 MIL/uL (ref 3.87–5.11)
WBC: 8.6 10*3/uL (ref 4.0–10.5)

## 2011-06-05 LAB — COMPREHENSIVE METABOLIC PANEL
Albumin: 4.5 g/dL (ref 3.5–5.2)
Alkaline Phosphatase: 62 U/L (ref 39–117)
BUN: 24 mg/dL — ABNORMAL HIGH (ref 6–23)
Chloride: 99 mEq/L (ref 96–112)
Glucose, Bld: 110 mg/dL — ABNORMAL HIGH (ref 70–99)
Potassium: 3 mEq/L — ABNORMAL LOW (ref 3.5–5.1)
Total Bilirubin: 0.7 mg/dL (ref 0.3–1.2)

## 2011-06-05 LAB — POCT I-STAT TROPONIN I

## 2011-07-20 LAB — COMPREHENSIVE METABOLIC PANEL
Albumin: 4.5
BUN: 15
Calcium: 10
Creatinine, Ser: 0.79
Potassium: 3.3 — ABNORMAL LOW
Total Protein: 8.4 — ABNORMAL HIGH

## 2011-07-20 LAB — DIFFERENTIAL
Lymphocytes Relative: 11 — ABNORMAL LOW
Lymphs Abs: 0.9
Monocytes Absolute: 0.2
Monocytes Relative: 3
Neutro Abs: 7.2

## 2011-07-20 LAB — URINALYSIS, ROUTINE W REFLEX MICROSCOPIC
Bilirubin Urine: NEGATIVE
Hgb urine dipstick: NEGATIVE
Specific Gravity, Urine: 1.035 — ABNORMAL HIGH
pH: 8

## 2011-07-20 LAB — URINE MICROSCOPIC-ADD ON

## 2011-07-20 LAB — PREGNANCY, URINE: Preg Test, Ur: NEGATIVE

## 2011-07-20 LAB — ETHANOL: Alcohol, Ethyl (B): <5

## 2011-07-20 LAB — CBC
HCT: 42.4
Platelets: 293
RDW: 13.4

## 2011-07-20 LAB — RAPID URINE DRUG SCREEN, HOSP PERFORMED
Benzodiazepines: NOT DETECTED
Tetrahydrocannabinol: POSITIVE — AB

## 2011-08-02 LAB — COMPREHENSIVE METABOLIC PANEL
ALT: 28
ALT: 41 — ABNORMAL HIGH
AST: 26
AST: 46 — ABNORMAL HIGH
Albumin: 3.7
Albumin: 4.1
Alkaline Phosphatase: 51
CO2: 19
Calcium: 9.3
Calcium: 9.3
Chloride: 107
Glucose, Bld: 107 — ABNORMAL HIGH
Potassium: 3.2 — ABNORMAL LOW
Sodium: 137
Sodium: 138
Total Protein: 6.6

## 2011-08-02 LAB — BASIC METABOLIC PANEL
BUN: 6
CO2: 25
Calcium: 9.1
Chloride: 105
Creatinine, Ser: 0.75
Glucose, Bld: 97

## 2011-08-02 LAB — MAGNESIUM: Magnesium: 1.8

## 2011-08-03 LAB — DIFFERENTIAL
Basophils Relative: 0
Eosinophils Relative: 0
Monocytes Absolute: 0.3
Monocytes Relative: 2 — ABNORMAL LOW
Neutro Abs: 10 — ABNORMAL HIGH

## 2011-08-03 LAB — COMPREHENSIVE METABOLIC PANEL
AST: 49 — ABNORMAL HIGH
Albumin: 4.6
Alkaline Phosphatase: 66
BUN: 17
Potassium: 3.5
Sodium: 142
Total Protein: 8.1

## 2011-08-03 LAB — WET PREP, GENITAL
Trich, Wet Prep: NONE SEEN
Yeast Wet Prep HPF POC: NONE SEEN

## 2011-08-03 LAB — URINALYSIS, ROUTINE W REFLEX MICROSCOPIC
Glucose, UA: NEGATIVE
Ketones, ur: >80 — AB
Protein, ur: NEGATIVE

## 2011-08-03 LAB — CBC
HCT: 42
MCHC: 33.6
MCV: 91.8
Platelets: 310

## 2011-08-03 LAB — GC/CHLAMYDIA PROBE AMP, GENITAL
Chlamydia, DNA Probe: NEGATIVE
GC Probe Amp, Genital: POSITIVE — AB

## 2011-08-03 LAB — URINE MICROSCOPIC-ADD ON

## 2011-09-21 ENCOUNTER — Encounter: Payer: Self-pay | Admitting: Emergency Medicine

## 2011-09-21 ENCOUNTER — Observation Stay (HOSPITAL_COMMUNITY)
Admission: EM | Admit: 2011-09-21 | Discharge: 2011-09-22 | Disposition: A | Discharge status: Home / Self Care | Payer: Self-pay | Attending: Internal Medicine | Admitting: Internal Medicine

## 2011-09-21 DIAGNOSIS — R112 Nausea with vomiting, unspecified: Principal | ICD-10-CM | POA: Insufficient documentation

## 2011-09-21 DIAGNOSIS — N946 Dysmenorrhea, unspecified: Secondary | ICD-10-CM | POA: Insufficient documentation

## 2011-09-21 DIAGNOSIS — R109 Unspecified abdominal pain: Secondary | ICD-10-CM | POA: Insufficient documentation

## 2011-09-21 DIAGNOSIS — N939 Abnormal uterine and vaginal bleeding, unspecified: Secondary | ICD-10-CM

## 2011-09-21 DIAGNOSIS — N926 Irregular menstruation, unspecified: Secondary | ICD-10-CM

## 2011-09-21 DIAGNOSIS — N739 Female pelvic inflammatory disease, unspecified: Secondary | ICD-10-CM

## 2011-09-21 LAB — DIFFERENTIAL
Basophils Absolute: 0 10*3/uL (ref 0.0–0.1)
Lymphocytes Relative: 10 % — ABNORMAL LOW (ref 12–46)
Monocytes Absolute: 0.6 10*3/uL (ref 0.1–1.0)
Monocytes Relative: 4 % (ref 3–12)
Neutro Abs: 13 10*3/uL — ABNORMAL HIGH (ref 1.7–7.7)

## 2011-09-21 LAB — URINE MICROSCOPIC-ADD ON

## 2011-09-21 LAB — COMPREHENSIVE METABOLIC PANEL
ALT: 19 U/L (ref 0–35)
AST: 31 U/L (ref 0–37)
Alkaline Phosphatase: 69 U/L (ref 39–117)
GFR calc Af Amer: >90 mL/min (ref >90)
Glucose, Bld: 130 mg/dL — ABNORMAL HIGH (ref 70–99)
Potassium: 4 mEq/L (ref 3.5–5.1)
Sodium: 139 mEq/L (ref 135–145)
Total Protein: 8.2 g/dL (ref 6.0–8.3)

## 2011-09-21 LAB — CBC
HCT: 41.9 % (ref 36.0–46.0)
Hemoglobin: 14.4 g/dL (ref 12.0–15.0)
RBC: 4.62 MIL/uL (ref 3.87–5.11)
WBC: 15.1 10*3/uL — ABNORMAL HIGH (ref 4.0–10.5)

## 2011-09-21 LAB — URINALYSIS, ROUTINE W REFLEX MICROSCOPIC
Glucose, UA: NEGATIVE mg/dL
Specific Gravity, Urine: 1.012 (ref 1.005–1.030)
Urobilinogen, UA: 0.2 mg/dL (ref 0.0–1.0)

## 2011-09-21 MED ORDER — SODIUM CHLORIDE 0.9 % IV BOLUS (SEPSIS)
1000.0000 mL | Freq: Once | INTRAVENOUS | Status: AC
  Administered 2011-09-21: 1000 mL via INTRAVENOUS

## 2011-09-21 MED ORDER — ONDANSETRON 4 MG PO TBDP
8.0000 mg | ORAL_TABLET | Freq: Once | ORAL | Status: AC
  Administered 2011-09-21: 8 mg via ORAL
  Filled 2011-09-21: qty 2

## 2011-09-21 MED ORDER — ONDANSETRON HCL 4 MG/2ML IJ SOLN
4.0000 mg | Freq: Once | INTRAMUSCULAR | Status: AC
  Administered 2011-09-21: 4 mg via INTRAVENOUS
  Filled 2011-09-21: qty 2

## 2011-09-21 MED ORDER — MORPHINE SULFATE 4 MG/ML IJ SOLN
4.0000 mg | Freq: Once | INTRAMUSCULAR | Status: AC
  Administered 2011-09-21: 4 mg via INTRAVENOUS
  Filled 2011-09-21: qty 1

## 2011-09-21 MED ORDER — SODIUM CHLORIDE 0.9 % IV SOLN
25.0000 mg | INTRAVENOUS | Status: AC
  Administered 2011-09-22: 25 mg via INTRAVENOUS
  Filled 2011-09-21: qty 1

## 2011-09-21 MED ORDER — HYDROCODONE-ACETAMINOPHEN 5-325 MG PO TABS: 1.0000 | ORAL_TABLET | Freq: Four times a day (QID) | ORAL | Status: AC | PRN

## 2011-09-21 MED ORDER — ONDANSETRON 4 MG PO TBDP: 4.0000 mg | ORAL_TABLET | Freq: Four times a day (QID) | ORAL | Status: AC | PRN

## 2011-09-21 NOTE — ED Notes (Signed)
Pt w/emesis of 500 cc upon my entering the room.  IV placed.  Family given happy meals and drinks.

## 2011-09-21 NOTE — ED Provider Notes (Signed)
Pt with continued vomiting abd soft, doubt acute abd/gyn process Will continue to treat nausea/vomiting She also reports h/o diarrhea today  Joya Gaskins, MD 09/21/11 2258

## 2011-09-21 NOTE — ED Notes (Signed)
Family at bedside. 

## 2011-09-21 NOTE — ED Provider Notes (Signed)
History     CSN: [id number] Arrival date & time: 09/21/2011  3:12 PM   First MD Initiated Contact with Patient 09/21/11 1722      Chief Complaint  Patient presents with  . Nausea  . Abdominal Cramping    (Consider location/radiation/quality/duration/timing/severity/associated sxs/prior treatment) HPI Comments: Patient states that she has been having nausea and vomiting since earlier today.  In addition she is on her period however she is never had these symptoms before.  She denies abdominal pain but does report abdominal cramping.  She denies fever, night sweats, chills.  Patient has no history of abdominal surgery.  Patient has no other complaints. Pt states she is a virgin.  Patient is a 20 y.o. female presenting with cramps. The history is provided by the patient.  Abdominal Cramping The primary symptoms of the illness include nausea and vomiting. The primary symptoms of the illness do not include abdominal pain, fever, shortness of breath, diarrhea or dysuria.  Symptoms associated with the illness do not include chills, constipation, urgency or hematuria.    History reviewed. No pertinent past medical history.  History reviewed. No pertinent past surgical history.  No family history on file.  History  Substance Use Topics  . Smoking status: Never Smoker   . Smokeless tobacco: Not on file  . Alcohol Use: No    OB History    Grav Para Term Preterm Abortions TAB SAB Ect Mult Living                  Review of Systems  Constitutional: Positive for appetite change. Negative for fever and chills.  HENT: Negative for neck stiffness and dental problem.   Eyes: Negative for visual disturbance.  Respiratory: Negative for cough, chest tightness, shortness of breath and wheezing.   Cardiovascular: Negative for chest pain.  Gastrointestinal: Positive for nausea and vomiting. Negative for abdominal pain, diarrhea, constipation, blood in stool, abdominal distention, anal bleeding  and rectal pain.  Genitourinary: Negative for dysuria, urgency, hematuria and flank pain.  Musculoskeletal: Negative for myalgias and arthralgias.  Skin: Negative for rash.  Neurological: Negative for dizziness, syncope, speech difficulty, light-headedness, numbness and headaches.  Hematological: Does not bruise/bleed easily.  All other systems reviewed and are negative.    Allergies  Vancomycin  Home Medications   Current Outpatient Rx  Name Route Sig Dispense Refill  . HYDROCODONE-ACETAMINOPHEN 5-325 MG PO TABS Oral Take 1 tablet by mouth every 6 (six) hours as needed for pain. 15 tablet 0  . ONDANSETRON 4 MG PO TBDP Oral Take 1 tablet (4 mg total) by mouth every 6 (six) hours as needed for nausea. 6 tablet 0    BP 118/78  Pulse 76  Temp(Src) 97.7 F (36.5 C) (Oral)  Resp 16  SpO2 100%  Physical Exam  Nursing note and vitals reviewed. Constitutional: She is oriented to person, place, and time. She appears well-developed and well-nourished. No distress.  HENT:  Head: Normocephalic and atraumatic.  Eyes:       Normal appearance  Neck: Normal range of motion.  Cardiovascular: Normal rate and regular rhythm.   Pulmonary/Chest: Effort normal and breath sounds normal.  Abdominal: Soft. Bowel sounds are normal. She exhibits no distension and no mass. There is no tenderness. There is no rebound and no guarding.       No CVA tenderness  Neurological: She is alert and oriented to person, place, and time.  Skin: Skin is warm and dry. No rash noted.  Psychiatric: She  has a normal mood and affect. Her behavior is normal.    ED Course  Procedures (including critical care time)  Discussed need to followup with an OB/GYN doctor in regards to similar symptoms occurring in the past while the patient is having menstruation.    Labs Reviewed  CBC - Abnormal; Notable for the following:    WBC 15.1 (*)    All other components within normal limits  DIFFERENTIAL - Abnormal; Notable  for the following:    Neutrophils Relative 87 (*)    Neutro Abs 13.0 (*)    Lymphocytes Relative 10 (*)    All other components within normal limits  URINALYSIS, ROUTINE W REFLEX MICROSCOPIC - Abnormal; Notable for the following:    APPearance CLOUDY (*)    Hgb urine dipstick LARGE (*)    Ketones, ur 40 (*)    All other components within normal limits  COMPREHENSIVE METABOLIC PANEL - Abnormal; Notable for the following:    Glucose, Bld 130 (*)    All other components within normal limits  URINE MICROSCOPIC-ADD ON - Abnormal; Notable for the following:    Squamous Epithelial / LPF FEW (*)    All other components within normal limits  PREGNANCY, URINE   No results found.   1. Nausea & vomiting     Pt has been given multiple does of Zofran and is still unable to tolerate PO fluids. Paging unassigned internal medicine to admit for Intractable vomiting.   Pt will be admitted to triad team 2; med surge bed  MDM  Intractable vomiting  Dysmenorrhea            [place], [place] 09/22/11 0110

## 2011-09-21 NOTE — ED Notes (Signed)
PT GIVEN SOME ICE WATER AND A FEW PACKS OF SALTINES

## 2011-09-21 NOTE — ED Notes (Signed)
Pt started vomiting in hallway, and returned to room MD notified

## 2011-09-21 NOTE — ED Notes (Signed)
Pt family advised pt vomited after eating water and saltines

## 2011-09-21 NOTE — ED Notes (Signed)
Pt c/o nausea, vomiting and abd. Cramping st's she is on per period and is usually like this

## 2011-09-21 NOTE — ED Notes (Signed)
Pt sts she cannot give a urine sample at this time. Pt sts she wants to wait until her fluid bag is finished.

## 2011-09-22 ENCOUNTER — Emergency Department (HOSPITAL_COMMUNITY): Payer: Self-pay

## 2011-09-22 ENCOUNTER — Encounter (HOSPITAL_COMMUNITY): Payer: Self-pay | Admitting: Internal Medicine

## 2011-09-22 DIAGNOSIS — R112 Nausea with vomiting, unspecified: Secondary | ICD-10-CM | POA: Diagnosis present

## 2011-09-22 LAB — COMPREHENSIVE METABOLIC PANEL
ALT: 11 U/L (ref 0–35)
AST: 17 U/L (ref 0–37)
Albumin: 3.3 g/dL — ABNORMAL LOW (ref 3.5–5.2)
Calcium: 8.2 mg/dL — ABNORMAL LOW (ref 8.4–10.5)
Sodium: 140 mEq/L (ref 135–145)
Total Protein: 6.1 g/dL (ref 6.0–8.3)

## 2011-09-22 LAB — GLUCOSE, CAPILLARY: Glucose-Capillary: 80 mg/dL (ref 70–99)

## 2011-09-22 LAB — CBC
HCT: 34 % — ABNORMAL LOW (ref 36.0–46.0)
Hemoglobin: 11.4 g/dL — ABNORMAL LOW (ref 12.0–15.0)
MCH: 30.6 pg (ref 26.0–34.0)
MCHC: 33.5 g/dL (ref 30.0–36.0)
MCV: 91.4 fL (ref 78.0–100.0)

## 2011-09-22 MED ORDER — SODIUM CHLORIDE 0.9 % IV SOLN
INTRAVENOUS | Status: DC
  Administered 2011-09-22: 12:00:00 via INTRAVENOUS
  Administered 2011-09-22: 75 mL/h via INTRAVENOUS

## 2011-09-22 MED ORDER — KETOROLAC TROMETHAMINE 15 MG/ML IJ SOLN
15.0000 mg | Freq: Three times a day (TID) | INTRAMUSCULAR | Status: DC
  Administered 2011-09-22 (×2): 15 mg via INTRAVENOUS
  Filled 2011-09-22 (×4): qty 1

## 2011-09-22 MED ORDER — ONDANSETRON HCL 4 MG/2ML IJ SOLN
4.0000 mg | Freq: Once | INTRAMUSCULAR | Status: AC
  Administered 2011-09-22: 4 mg via INTRAVENOUS
  Filled 2011-09-22: qty 2

## 2011-09-22 MED ORDER — SODIUM CHLORIDE 0.9 % IV BOLUS (SEPSIS)
1000.0000 mL | Freq: Once | INTRAVENOUS | Status: AC
  Administered 2011-09-22: 1000 mL via INTRAVENOUS

## 2011-09-22 MED ORDER — ONDANSETRON HCL 4 MG PO TABS: 4.0000 mg | ORAL_TABLET | Freq: Four times a day (QID) | ORAL | Status: DC | PRN

## 2011-09-22 MED ORDER — ACETAMINOPHEN 325 MG PO TABS: 650.0000 mg | ORAL_TABLET | Freq: Four times a day (QID) | ORAL | Status: DC | PRN

## 2011-09-22 MED ORDER — ACETAMINOPHEN 650 MG RE SUPP: 650.0000 mg | Freq: Four times a day (QID) | RECTAL | Status: DC | PRN

## 2011-09-22 MED ORDER — SODIUM CHLORIDE 0.9 % IV BOLUS (SEPSIS)
500.0000 mL | Freq: Once | INTRAVENOUS | Status: AC
  Administered 2011-09-22: 500 mL via INTRAVENOUS

## 2011-09-22 MED ORDER — ONDANSETRON HCL 4 MG/2ML IJ SOLN: 4.0000 mg | Freq: Four times a day (QID) | INTRAMUSCULAR | Status: DC | PRN

## 2011-09-22 NOTE — ED Provider Notes (Signed)
Medical screening examination/treatment/procedure(s) were conducted as a shared visit with non-physician practitioner(s) and myself.  I personally evaluated the patient during the encounter   Joya Gaskins, MD 09/22/11 2202

## 2011-09-22 NOTE — Progress Notes (Signed)
Scripts given, meds gone over, follow up appointments to be made. Dc instructions gone over, brat diet instructions given, s/s of infection and or worsening conditions gone over. Patient verbalized understanding of all.

## 2011-09-22 NOTE — Progress Notes (Signed)
Utilization review completed.  

## 2011-09-22 NOTE — Discharge Summary (Signed)
Physician Discharge Summary  Patient ID: CHRISTYN GUTKOWSKI MRN: [id number] DOB/AGE: 12/06/1990 20 y.o.  Admit date: 09/21/2011 Discharge date: 09/22/2011  Primary Care Physician:  Edd Arbour, MD   Discharge Diagnoses:    Active Problems:  Nausea & vomiting Dysmenorrhea   Current Discharge Medication List    START taking these medications   Details  HYDROcodone-acetaminophen (NORCO) 5-325 MG per tablet Take 1 tablet by mouth every 6 (six) hours as needed for pain. Qty: 15 tablet, Refills: 0    ondansetron (ZOFRAN ODT) 4 MG disintegrating tablet Take 1 tablet (4 mg total) by mouth every 6 (six) hours as needed for nausea. Qty: 6 tablet, Refills: 0         Disposition and Follow-up:  Patient will be discharged home today in stable and improved condition. Has been instructed to followup with her OB/GYN in about 2-3 weeks.  Consults:  none    Significant Diagnostic Studies:  Dg Abd Acute W/chest  09/22/2011  *RADIOLOGY REPORT*  Clinical Data: Vomiting for 1 day.  ACUTE ABDOMEN SERIES (ABDOMEN 2 VIEW & CHEST 1 VIEW)  Comparison: Chest and abdominal radiographs performed 04/16/2007, and chest radiograph performed 06/05/2011  Findings: The lungs are well-aerated and clear.  There is no evidence of focal opacification, pleural effusion or pneumothorax. The cardiomediastinal silhouette is within normal limits.  There is a relative paucity of bowel gas within the abdomen.  Air and fluid partially fill the stomach; a small amount of air is seen within the colon.  There is no evidence for obstruction.  No free intra-abdominal air is identified on the provided upright view.  No acute osseous abnormalities are seen; the sacroiliac joints are unremarkable in appearance.  IMPRESSION:  1. Relative paucity of bowel gas within the abdomen.  No evidence for obstruction; no free intra-abdominal air seen. 2.  No acute cardiopulmonary process identified.  Original Report Authenticated By: Tonia Ghent, M.D.    Brief H and P: For complete details please refer to admission H and P, but in brief patient is a pleasant 20 year old black gentleman who came into the hospital with a history of nausea and vomiting. This has coincided with the starting of her menstrual cycle. She had been in the emergency department for about 12 hours despite which he still had intractable vomiting and because of this we are asked to admit her for further evaluation and management.    Hospital Course:  Active Problems:  Nausea & vomiting Dysmenorrhea  #1 dysmenorrhea with nausea and vomiting: This has improved with a couple doses of Toradol and IV fluids. She will be discharged home today in stable condition. She has tolerated solid meals without any further emesis.  Time spent on Discharge: Less than 30 minutes.  SignedChaya Jan 09/22/2011, 2:49 PM

## 2011-09-22 NOTE — H&P (Signed)
[name] is an 20 y.o. female. PCP is unassigned  Chief Complaint: Nausea vomiting.  HPI: 9-year-old female with a previous history of spontaneous pneumomediastinum present to the ER because of nausea vomiting since yesterday afternoon. Patient has been given multiple doses of IV antiemetics and pain medications and has been in the ER for almost 12 hours despite which patient is still vomiting. Patient in addition also has her menstrual cycle started yesterday and has crampy pain. She has history of the nausea vomiting associated with them is dysmenorrhea. Otherwise she does not have any abdominal pain. Denies any fever chills, dysuria. She did have 2 episodes of diarrhea. Denies any recent use of antibiotics. Patient has been admitted for further management of her persistent nausea and vomiting.  History reviewed. No pertinent past medical history.  History reviewed. No pertinent past surgical history.  Family History  Problem Relation Age of Onset  . Hypertension Mother    Social History:  reports that she has never smoked. She does not have any smokeless tobacco history on file. She reports that she does not drink alcohol or use illicit drugs.  Allergies:  Allergies  Allergen Reactions  . Vancomycin Itching    Medications Prior to Admission  Medication Dose Route Frequency Provider Last Rate Last Dose  . morphine 4 MG/ML injection 4 mg  4 mg Intravenous Once [employer], [place]   4 mg at 09/21/11 1749  . morphine 4 MG/ML injection 4 mg  4 mg Intravenous Once [employer], [place]   4 mg at 09/21/11 2030  . ondansetron (ZOFRAN) injection 4 mg  4 mg Intravenous Once [place], [place]   4 mg at 09/21/11 1749  . ondansetron (ZOFRAN) injection 4 mg  4 mg Intravenous Once [place], [place]   4 mg at 09/21/11 2029  . ondansetron (ZOFRAN) injection 4 mg  4 mg Intravenous Once [employer], [place]   4 mg at 09/22/11 0040  . ondansetron (ZOFRAN-ODT) disintegrating tablet 8 mg  8 mg Oral Once [place],  [place]   8 mg at 09/21/11 2130  . promethazine (PHENERGAN) 25 mg in sodium chloride 0.9 % 1,000 mL infusion  25 mg Intravenous To Major Lisette Paz, PA 500 mL/hr at 09/22/11 0008 25 mg at 09/22/11 0008  . sodium chloride 0.9 % bolus 1,000 mL  1,000 mL Intravenous Once Lisette Paz, PA   1,000 mL at 09/21/11 1749  . sodium chloride 0.9 % bolus 1,000 mL  1,000 mL Intravenous Once [employer], [place]   1,000 mL at 09/21/11 2000  . sodium chloride 0.9 % bolus 1,000 mL  1,000 mL Intravenous Once Lisette Paz, PA   1,000 mL at 09/22/11 0022   No current outpatient prescriptions on file as of 09/21/2011.    Results for orders placed during the hospital encounter of 09/21/11 (from the past 48 hour(s))  CBC     Status: Abnormal   Collection Time   09/21/11  5:35 PM      Component Value Range Comment   WBC 15.1 (*) 4.0 - 10.5 (K/uL)    RBC 4.62  3.87 - 5.11 (MIL/uL)    Hemoglobin 14.4  12.0 - 15.0 (g/dL)    HCT 16.1  09.6 - 04.5 (%)    MCV 90.7  78.0 - 100.0 (fL)    MCH 31.2  26.0 - 34.0 (pg)    MCHC 34.4  30.0 - 36.0 (g/dL)    RDW 40.9  81.1 - 91.4 (%)  Platelets 295  150 - 400 (K/uL)   DIFFERENTIAL     Status: Abnormal   Collection Time   09/21/11  5:35 PM      Component Value Range Comment   Neutrophils Relative 87 (*) 43 - 77 (%)    Neutro Abs 13.0 (*) 1.7 - 7.7 (K/uL)    Lymphocytes Relative 10 (*) 12 - 46 (%)    Lymphs Abs 1.4  0.7 - 4.0 (K/uL)    Monocytes Relative 4  3 - 12 (%)    Monocytes Absolute 0.6  0.1 - 1.0 (K/uL)    Eosinophils Relative 0  0 - 5 (%)    Eosinophils Absolute 0.0  0.0 - 0.7 (K/uL)    Basophils Relative 0  0 - 1 (%)    Basophils Absolute 0.0  0.0 - 0.1 (K/uL)   COMPREHENSIVE METABOLIC PANEL     Status: Abnormal   Collection Time   09/21/11  5:35 PM      Component Value Range Comment   Sodium 139  135 - 145 (mEq/L)    Potassium 4.0  3.5 - 5.1 (mEq/L)    Chloride 103  96 - 112 (mEq/L)    CO2 21  19 - 32 (mEq/L)    Glucose, Bld 130 (*) 70 - 99 (mg/dL)    BUN 10  6 -  23 (mg/dL)    Creatinine, Ser 5.36  0.50 - 1.10 (mg/dL)    Calcium 64.4  8.4 - 10.5 (mg/dL)    Total Protein 8.2  6.0 - 8.3 (g/dL)    Albumin 4.6  3.5 - 5.2 (g/dL)    AST 31  0 - 37 (U/L) HEMOLYSIS AT THIS LEVEL MAY AFFECT RESULT   ALT 19  0 - 35 (U/L)    Alkaline Phosphatase 69  39 - 117 (U/L)    Total Bilirubin 0.6  0.3 - 1.2 (mg/dL)    GFR calc non Af Amer >90  >90 (mL/min)    GFR calc Af Amer >90  >90 (mL/min)   PREGNANCY, URINE     Status: Normal   Collection Time   09/21/11  7:13 PM      Component Value Range Comment   Preg Test, Ur NEGATIVE     URINALYSIS, ROUTINE W REFLEX MICROSCOPIC     Status: Abnormal   Collection Time   09/21/11  7:13 PM      Component Value Range Comment   Color, Urine YELLOW  YELLOW     APPearance CLOUDY (*) CLEAR     Specific Gravity, Urine 1.012  1.005 - 1.030     pH 8.0  5.0 - 8.0     Glucose, UA NEGATIVE  NEGATIVE (mg/dL)    Hgb urine dipstick LARGE (*) NEGATIVE     Bilirubin Urine NEGATIVE  NEGATIVE     Ketones, ur 40 (*) NEGATIVE (mg/dL)    Protein, ur NEGATIVE  NEGATIVE (mg/dL)    Urobilinogen, UA 0.2  0.0 - 1.0 (mg/dL)    Nitrite NEGATIVE  NEGATIVE     Leukocytes, UA NEGATIVE  NEGATIVE    URINE MICROSCOPIC-ADD ON     Status: Abnormal   Collection Time   09/21/11  7:13 PM      Component Value Range Comment   Squamous Epithelial / LPF FEW (*) RARE     WBC, UA 3-6  <3 (WBC/hpf)    RBC / HPF TOO NUMEROUS TO COUNT  <3 (RBC/hpf)    Bacteria, UA RARE  RARE  Urine-Other MUCOUS PRESENT      Dg Abd Acute W/chest  09/22/2011  *RADIOLOGY REPORT*  Clinical Data: Vomiting for 1 day.  ACUTE ABDOMEN SERIES (ABDOMEN 2 VIEW & CHEST 1 VIEW)  Comparison: Chest and abdominal radiographs performed 04/16/2007, and chest radiograph performed 06/05/2011  Findings: The lungs are well-aerated and clear.  There is no evidence of focal opacification, pleural effusion or pneumothorax. The cardiomediastinal silhouette is within normal limits.  There is a relative  paucity of bowel gas within the abdomen.  Air and fluid partially fill the stomach; a small amount of air is seen within the colon.  There is no evidence for obstruction.  No free intra-abdominal air is identified on the provided upright view.  No acute osseous abnormalities are seen; the sacroiliac joints are unremarkable in appearance.  IMPRESSION:  1. Relative paucity of bowel gas within the abdomen.  No evidence for obstruction; no free intra-abdominal air seen. 2.  No acute cardiopulmonary process identified.  Original Report Authenticated By: Tonia Ghent, M.D.    Review of Systems  Constitutional: Negative.   HENT: Negative.   Eyes: Negative.   Respiratory: Negative.   Cardiovascular: Negative.   Gastrointestinal: Positive for nausea, vomiting and diarrhea.  Genitourinary: Negative.   Musculoskeletal: Negative.   Skin: Negative.   Endo/Heme/Allergies: Negative.   Psychiatric/Behavioral: Negative.     Blood pressure 106/68, pulse 88, temperature 98.1 F (36.7 C), temperature source Oral, resp. rate 16, SpO2 100.00%. Physical Exam  Constitutional: She is oriented to person, place, and time. She appears well-developed. No distress.  HENT:  Head: Normocephalic and atraumatic.  Right Ear: External ear normal.  Left Ear: External ear normal.  Nose: Nose normal.  Mouth/Throat: Oropharynx is clear and moist. No oropharyngeal exudate.  Eyes: Conjunctivae and EOM are normal. Pupils are equal, round, and reactive to light. Right eye exhibits no discharge. Left eye exhibits no discharge. No scleral icterus.  Neck: Normal range of motion. Neck supple.  Cardiovascular: Normal rate and normal heart sounds.   Respiratory: Effort normal and breath sounds normal. No respiratory distress. She has no wheezes. She has no rales.  GI: Soft. Bowel sounds are normal. She exhibits no distension. There is no tenderness. There is no rebound.  Musculoskeletal: Normal range of motion. She exhibits no edema  and no tenderness.  Neurological: She is alert and oriented to person, place, and time. She has normal reflexes. No cranial nerve deficit. Coordination normal.  Skin: Skin is warm and dry. No rash noted. She is not diaphoretic. No erythema.  Psychiatric: Her behavior is normal.     Assessment/Plan #1. Persistent nausea and vomiting. #2. Dysmenorrhea. #3. History of spontaneous pneumomediastinum.  Plan Admit to medical floor for observation. We will keep patient n.p.o. and on IV fluids. Continue with IV antiemetics and for pain we'll use Toradol. If the nausea vomiting improves we'll start diet and probably discharge home.  Eduard Clos. 09/22/2011, 2:23 AM

## 2011-09-22 NOTE — Progress Notes (Signed)
MD notified and aware of pt BP this am (82/46). Dr. Jae Dire will increase IV fluid rate. Pt alert & oriented and answers questions appropriately.

## 2011-09-23 NOTE — Progress Notes (Signed)
   CARE MANAGEMENT NOTE 09/23/2011  Patient:  [name], [name]   Account Number:  [id number]  Date Initiated:  09/23/2011  Documentation initiated by:  Donn Pierini  Subjective/Objective Assessment:   Pt admitted with N/V     Action/Plan:   PTA pt lived at home was independent with ADLs   Anticipated DC Date:  09/22/2011   Anticipated DC Plan:  HOME/SELF CARE      DC Planning Services  CM consult      Choice offered to / List presented to:             Status of service:  Completed, signed off Medicare Important Message given?   (If response is "NO", the following Medicare IM given date fields will be blank) Date Medicare IM given:   Date Additional Medicare IM given:    Discharge Disposition:  HOME/SELF CARE  Per UR Regulation:  Reviewed for med. necessity/level of care/duration of stay  Comments:  PCP- [place]  09/22/11- Donn Pierini RN, BSN [phone] Pt for discharge today, no discharge needs identified

## 2011-12-12 ENCOUNTER — Encounter (HOSPITAL_COMMUNITY): Payer: Self-pay

## 2011-12-12 ENCOUNTER — Emergency Department (HOSPITAL_COMMUNITY)
Admission: EM | Admit: 2011-12-12 | Discharge: 2011-12-12 | Disposition: A | Discharge status: Home / Self Care | Payer: Self-pay | Attending: Emergency Medicine | Admitting: Emergency Medicine

## 2011-12-12 DIAGNOSIS — R197 Diarrhea, unspecified: Secondary | ICD-10-CM | POA: Insufficient documentation

## 2011-12-12 DIAGNOSIS — R112 Nausea with vomiting, unspecified: Secondary | ICD-10-CM | POA: Insufficient documentation

## 2011-12-12 DIAGNOSIS — R10819 Abdominal tenderness, unspecified site: Secondary | ICD-10-CM | POA: Insufficient documentation

## 2011-12-12 LAB — BASIC METABOLIC PANEL
Calcium: 9.1 mg/dL (ref 8.4–10.5)
Creatinine, Ser: 0.8 mg/dL (ref 0.50–1.10)
GFR calc Af Amer: >90 mL/min (ref >90)
GFR calc non Af Amer: >90 mL/min (ref >90)

## 2011-12-12 LAB — URINALYSIS, ROUTINE W REFLEX MICROSCOPIC
Protein, ur: 100 mg/dL — AB
Urobilinogen, UA: 1 mg/dL (ref 0.0–1.0)

## 2011-12-12 LAB — URINE MICROSCOPIC-ADD ON

## 2011-12-12 LAB — DIFFERENTIAL
Basophils Absolute: 0 10*3/uL (ref 0.0–0.1)
Basophils Relative: 0 % (ref 0–1)
Eosinophils Absolute: 0 10*3/uL (ref 0.0–0.7)
Eosinophils Relative: 0 % (ref 0–5)
Monocytes Absolute: 0.4 10*3/uL (ref 0.1–1.0)
Neutro Abs: 5.2 10*3/uL (ref 1.7–7.7)

## 2011-12-12 LAB — CBC
HCT: 37 % (ref 36.0–46.0)
MCHC: 34.6 g/dL (ref 30.0–36.0)
RDW: 12.7 % (ref 11.5–15.5)

## 2011-12-12 LAB — PREGNANCY, URINE: Preg Test, Ur: NEGATIVE

## 2011-12-12 MED ORDER — PROMETHAZINE HCL 25 MG PO TABS: 25.0000 mg | ORAL_TABLET | Freq: Four times a day (QID) | ORAL | Status: DC | PRN

## 2011-12-12 MED ORDER — SODIUM CHLORIDE 0.9 % IV BOLUS (SEPSIS)
1000.0000 mL | Freq: Once | INTRAVENOUS | Status: AC
  Administered 2011-12-12: 1000 mL via INTRAVENOUS

## 2011-12-12 MED ORDER — MORPHINE SULFATE 4 MG/ML IJ SOLN
4.0000 mg | Freq: Once | INTRAMUSCULAR | Status: AC
  Administered 2011-12-12: 4 mg via INTRAVENOUS
  Filled 2011-12-12: qty 1

## 2011-12-12 MED ORDER — POTASSIUM CHLORIDE CRYS ER 20 MEQ PO TBCR
40.0000 meq | EXTENDED_RELEASE_TABLET | Freq: Once | ORAL | Status: AC
  Administered 2011-12-12: 40 meq via ORAL
  Filled 2011-12-12: qty 2

## 2011-12-12 MED ORDER — PROMETHAZINE HCL 25 MG RE SUPP: 25.0000 mg | Freq: Four times a day (QID) | RECTAL | Status: DC | PRN

## 2011-12-12 MED ORDER — ONDANSETRON HCL 4 MG/2ML IJ SOLN
4.0000 mg | Freq: Once | INTRAMUSCULAR | Status: AC
  Administered 2011-12-12: 4 mg via INTRAVENOUS
  Filled 2011-12-12: qty 2

## 2011-12-12 MED ORDER — ONDANSETRON HCL 4 MG/2ML IJ SOLN
INTRAMUSCULAR | Status: AC
  Filled 2011-12-12: qty 2

## 2011-12-12 MED ORDER — PROMETHAZINE HCL 25 MG/ML IJ SOLN
25.0000 mg | Freq: Once | INTRAMUSCULAR | Status: AC
  Administered 2011-12-12: 25 mg via INTRAVENOUS
  Filled 2011-12-12: qty 1

## 2011-12-12 NOTE — ED Provider Notes (Signed)
History     CSN: [id number]  Arrival date & time 12/12/11  1120   First MD Initiated Contact with Patient 12/12/11 1230      Chief Complaint  Patient presents with  . Emesis  . Nausea    (Consider location/radiation/quality/duration/timing/severity/associated sxs/prior treatment) Patient is a 21 y.o. female presenting with vomiting. The history is provided by the patient.  Emesis    21 year old generally healthy female presents with 3 days of nausea, vomiting, diarrhea. She states that she typically has similar symptoms monthly with her menses. She began menses on Friday, and has had symptoms since then. She has had be hospitalized the past for this. Last prior menstrual period was around January 20 and she does not believe that she could be pregnant. She denies fever, chills, abdominal pain. She's had no urinary symptoms. Denies URI symptoms such as rhinorrhea or cough. No known sick contacts. She has been unable to tolerate by mouth fluids or foods over the past several days, and states that she feels dehydrated.  History reviewed. No pertinent past medical history.  History reviewed. No pertinent past surgical history.  Family History  Problem Relation Age of Onset  . Hypertension Mother     History  Substance Use Topics  . Smoking status: Never Smoker   . Smokeless tobacco: Never Used  . Alcohol Use: No    OB History    Grav Para Term Preterm Abortions TAB SAB Ect Mult Living                  Review of Systems  Gastrointestinal: Positive for vomiting.  All other systems reviewed and are negative.    Allergies  Vancomycin  Home Medications  No current outpatient prescriptions on file.  BP 101/55  Pulse 70  Temp(Src) 98.6 F (37 C) (Oral)  Resp 20  SpO2 100%  Physical Exam  Nursing note and vitals reviewed. Constitutional: She is oriented to person, place, and time. She appears well-developed and well-nourished. No distress.       Vital signs stable.  She is non-tachycardic. Blood pressure normal.  HENT:  Head: Normocephalic and atraumatic.  Mouth/Throat: Oropharynx is clear and moist. No oropharyngeal exudate.  Eyes: Conjunctivae are normal.  Neck: Normal range of motion.  Cardiovascular: Normal rate, regular rhythm and normal heart sounds.   Pulmonary/Chest: Effort normal and breath sounds normal. She exhibits no tenderness.  Abdominal: Soft. Bowel sounds are normal. There is no tenderness. There is no rebound and no guarding.       Minimally tender to palpation over upper abdomen. Negative Murphy's sign.  Musculoskeletal: Normal range of motion.  Neurological: She is alert and oriented to person, place, and time.  Skin: Skin is warm and dry. No rash noted. She is not diaphoretic.  Psychiatric: She has a normal mood and affect.    ED Course  Procedures (including critical care time)  Labs Reviewed  URINALYSIS, ROUTINE W REFLEX MICROSCOPIC - Abnormal; Notable for the following:    Color, Urine AMBER (*) BIOCHEMICALS MAY BE AFFECTED BY COLOR   APPearance CLOUDY (*)    Specific Gravity, Urine 1.036 (*)    Hgb urine dipstick LARGE (*)    Bilirubin Urine SMALL (*)    Ketones, ur >80 (*)    Protein, ur 100 (*)    Leukocytes, UA SMALL (*)    All other components within normal limits  BASIC METABOLIC PANEL - Abnormal; Notable for the following:    Potassium 2.9 (*)  BUN 25 (*)    All other components within normal limits  URINE MICROSCOPIC-ADD ON - Abnormal; Notable for the following:    Squamous Epithelial / LPF FEW (*)    Bacteria, UA FEW (*)    All other components within normal limits  PREGNANCY, URINE  CBC  DIFFERENTIAL   No results found.   1. Nausea & vomiting       MDM  Pt with 3 days of n/v corresponding with menses. Labs significant for hypokalemia which was repleted here. Urine shows that she likely is dehydrated with elevated SGr, ketones. She does have small urine leukocytosis which feel is likely  physiologic (heme + from menses). She was able to tolerate ginger ale in the dept without further emesis and was rehydrated with 2L NS. Abd remains soft, NT. Pt will be dced home with rx for antiemetics and instructed to stick to clear diet. Return precautions discussed.        Grant Fontana, [place] 12/12/11 [phone]

## 2011-12-12 NOTE — ED Notes (Signed)
VHQ:[id number]<EX> Expected date:12/12/11<BR> Expected time:11:12 AM<BR> Means of arrival:Ambulance<BR> Comments:<BR> M80. 21 yo f. N/V/D, mildly orthostatic, 10 mins

## 2011-12-12 NOTE — ED Notes (Signed)
Patient stated reilef from zofran- [place] sandwich and ginger ale given

## 2011-12-12 NOTE — ED Notes (Signed)
Patient given ginger ale for fluid challenge per pt. Request.

## 2011-12-12 NOTE — Discharge Instructions (Signed)
Your labs today were normal. Please plan to stick to a clear liquid diet for the next 24 hours, then advance to a bland diet, and then slowly advance back to your regular diet. Use the Phenergan tablets as needed for nausea. Make sure to push extra fluids the next several days. If you're unable to keep down fluids, have increasing abdominal pain, or severe condition is worsening, please return to the ED.  Clear Liquid Diet The clear liquid dietconsists of foods that are liquid or will become liquid at room temperature.You should be able to see through the liquid and beverages. Examples of foods allowed on a clear liquid diet include fruit juice, broth or bouillon, gelatin, or frozen ice pops. The purpose of this diet is to provide necessary fluid, electrolytes such a sodium and potassium, and energy to keep the body functioning during times when you are not able to consume a regular diet.A clear liquid diet should not be continued for long periods of time as it is not nutritionally adequate.  REASONS FOR USING A CLEAR LIQUID DIET  In sudden onset (acute) conditions for a patient before or after surgery.   As the first step in oral feeding.   For fluid and electrolyte replacement in diarrheal diseases.   As a diet before certain medical tests are performed.  ADEQUACY The clear liquid diet is adequate only in ascorbic acid, according to the Recommended Dietary Allowances of the [employer]. CHOOSING FOODS Breads and Starches  Allowed:  None are allowed.   Avoid: All are avoided.  Vegetables  Allowed:  Strained tomato or vegetable juice.   Avoid: Any others.  Fruit  Allowed:  Strained fruit juices and fruit drinks. Include 1 serving of citrus or vitamin C-enriched fruit juice daily.   Avoid: Any others.  Meat and Meat Substitutes  Allowed:  None are allowed.   Avoid: All are avoided.  Milk  Allowed:  None are allowed.   Avoid: All are avoided.  Soups and  Combination Foods  Allowed:  Clear bouillon, broth, or strained broth-based soups.   Avoid: Any others.  Desserts and Sweets  Allowed:  Sugar, honey. High protein gelatin. Flavored gelatin, ices, or frozen ice pops that do not contain milk.   Avoid: Any others.  Fats and Oils  Allowed:  None are allowed.   Avoid: All are avoided.  Beverages  Allowed:  Carbonated beverages, cereal beverages, coffee (regular or decaffeinated), or tea.   Avoid: Any others.  Condiments  Allowed:  Iodized salt.   Avoid: Any others, including pepper.  Supplements  Allowed:  Liquid nutrition beverages.   Avoid: Any others that contain lactose or fiber.  SAMPLE MEAL PLAN Breakfast  4 oz strained orange juice.    to 1 cup gelatin (plain or fortified).   1 cup beverage (coffee or tea).   Sugar, if desired.  Midmorning Snack   cup gelatin (plain or fortified).  Lunch  1 cup broth or consomm.   4 oz strained grapefruit juice.    cup gelatin (plain or fortified).   1 cup beverage (coffee or tea).   Sugar, if desired.  Midafternoon Snack   cup fruit ice.    cup strained fruit juice.  Dinner  1 cup broth or consomm.    cup cranberry juice.    cup flavored gelatin (plain or fortified).   1 cup beverage (coffee or tea).   Sugar, if desired.  Evening Snack  4 oz strained apple juice (vitamin C-fortified).  cup flavored gelatin (plain or fortified).  Document Released: 10/03/2005 Document Revised: 06/15/2011 Document Reviewed: 12/31/2010 [facility] Patient Information 2012 [place], [place].B.R.A.T. Diet Your doctor has recommended the B.R.A.T. diet for you or your child until the condition improves. This is often used to help control diarrhea and vomiting symptoms. If you or your child can tolerate clear liquids, you may have:  Bananas.   Rice.   Applesauce.   Toast (and other simple starches such as crackers, potatoes, noodles).  Be sure to avoid dairy  products, meats, and fatty foods until symptoms are better. Fruit juices such as apple, grape, and prune juice can make diarrhea worse. Avoid these. Continue this diet for 2 days or as instructed by your caregiver. Document Released: 10/03/2005 Document Revised: 06/15/2011 Document Reviewed: 03/22/2007 [facility] Patient Information 2012 [place], [place].Nausea and Vomiting Nausea is a sick feeling that often comes before throwing up (vomiting). Vomiting is a reflex where stomach contents come out of your mouth. Vomiting can cause severe loss of body fluids (dehydration). Children and elderly adults can become dehydrated quickly, especially if they also have diarrhea. Nausea and vomiting are symptoms of a condition or disease. It is important to find the cause of your symptoms. CAUSES   Direct irritation of the stomach lining. This irritation can result from increased acid production (gastroesophageal reflux disease), infection, food poisoning, taking certain medicines (such as nonsteroidal anti-inflammatory drugs), alcohol use, or tobacco use.   Signals from the brain.These signals could be caused by a headache, heat exposure, an inner ear disturbance, increased pressure in the brain from injury, infection, a tumor, or a concussion, pain, emotional stimulus, or metabolic problems.   An obstruction in the gastrointestinal tract (bowel obstruction).   Illnesses such as diabetes, hepatitis, gallbladder problems, appendicitis, kidney problems, cancer, sepsis, atypical symptoms of a heart attack, or eating disorders.   Medical treatments such as chemotherapy and radiation.   Receiving medicine that makes you sleep (general anesthetic) during surgery.  DIAGNOSIS Your caregiver may ask for tests to be done if the problems do not improve after a few days. Tests may also be done if symptoms are severe or if the reason for the nausea and vomiting is not clear. Tests may include:  Urine tests.   Blood  tests.   Stool tests.   Cultures (to look for evidence of infection).   X-rays or other imaging studies.  Test results can help your caregiver make decisions about treatment or the need for additional tests. TREATMENT You need to stay well hydrated. Drink frequently but in small amounts.You may wish to drink water, sports drinks, clear broth, or eat frozen ice pops or gelatin dessert to help stay hydrated.When you eat, eating slowly may help prevent nausea.There are also some antinausea medicines that may help prevent nausea. HOME CARE INSTRUCTIONS   Take all medicine as directed by your caregiver.   If you do not have an appetite, do not force yourself to eat. However, you must continue to drink fluids.   If you have an appetite, eat a normal diet unless your caregiver tells you differently.   Eat a variety of complex carbohydrates (rice, wheat, potatoes, bread), lean meats, yogurt, fruits, and vegetables.   Avoid high-fat foods because they are more difficult to digest.   Drink enough water and fluids to keep your urine clear or pale yellow.   If you are dehydrated, ask your caregiver for specific rehydration instructions. Signs of dehydration may include:   Severe thirst.  Dry lips and mouth.   Dizziness.   Dark urine.   Decreasing urine frequency and amount.   Confusion.   Rapid breathing or pulse.  SEEK IMMEDIATE MEDICAL CARE IF:   You have blood or brown flecks (like coffee grounds) in your vomit.   You have black or bloody stools.   You have a severe headache or stiff neck.   You are confused.   You have severe abdominal pain.   You have chest pain or trouble breathing.   You do not urinate at least once every 8 hours.   You develop cold or clammy skin.   You continue to vomit for longer than 24 to 48 hours.   You have a fever.  MAKE SURE YOU:   Understand these instructions.   Will watch your condition.   Will get help right away if you  are not doing well or get worse.  Document Released: 10/03/2005 Document Revised: 06/15/2011 Document Reviewed: 03/02/2011 [facility] Patient Information 2012 [place], [place].

## 2011-12-12 NOTE — ED Notes (Signed)
Per EMS- Patient reports that she began having N/V/D 3 days ago. Patient states symptoms worse today. Patient also states that she has chest pain when she breathes in and has had a productive cough with mixed color sputum.

## 2011-12-13 NOTE — ED Provider Notes (Signed)
Medical screening examination/treatment/procedure(s) were conducted as a shared visit with non-physician practitioner(s) and myself.  I personally evaluated the patient during the encounter Pt w nvd. Ivf. Recheck abd soft nt, nd, taking pos well.   Suzi Roots, MD 12/13/11 [phone]

## 2012-06-19 ENCOUNTER — Emergency Department (INDEPENDENT_AMBULATORY_CARE_PROVIDER_SITE_OTHER)
Admission: EM | Admit: 2012-06-19 | Discharge: 2012-06-19 | Disposition: A | Discharge status: Home / Self Care | Payer: Self-pay | Source: Home / Self Care | Attending: Family Medicine | Admitting: Family Medicine

## 2012-06-19 ENCOUNTER — Encounter (HOSPITAL_COMMUNITY): Payer: Self-pay | Admitting: Emergency Medicine

## 2012-06-19 DIAGNOSIS — S61209A Unspecified open wound of unspecified finger without damage to nail, initial encounter: Secondary | ICD-10-CM

## 2012-06-19 DIAGNOSIS — Z23 Encounter for immunization: Secondary | ICD-10-CM

## 2012-06-19 DIAGNOSIS — S61219A Laceration without foreign body of unspecified finger without damage to nail, initial encounter: Secondary | ICD-10-CM

## 2012-06-19 MED ORDER — TETANUS-DIPHTH-ACELL PERTUSSIS 5-2.5-18.5 LF-MCG/0.5 IM SUSP
0.5000 mL | Freq: Once | INTRAMUSCULAR | Status: AC
  Administered 2012-06-19: 0.5 mL via INTRAMUSCULAR

## 2012-06-19 MED ORDER — TETANUS-DIPHTH-ACELL PERTUSSIS 5-2.5-18.5 LF-MCG/0.5 IM SUSP
INTRAMUSCULAR | Status: AC
  Filled 2012-06-19: qty 0.5

## 2012-06-19 NOTE — ED Notes (Signed)
Laceration to left index fingertip.  Occurred yesterday 06/18/12.  Around 1:00 p.m. Wound edges white-appears wet.  Bleeding controlled.  Cut with a knife, serrated edge.

## 2012-06-19 NOTE — Discharge Instructions (Signed)
Wound Care Wound care helps prevent pain and infection.  You may need a tetanus shot if:  You cannot remember when you had your last tetanus shot.   You have never had a tetanus shot.   The injury broke your skin.  If you need a tetanus shot and you choose not to have one, you may get tetanus. Sickness from tetanus can be serious. HOME CARE   Only take medicine as told by your doctor.   Clean the wound daily with mild soap and water.   Change any bandages (dressings) as told by your doctor.   Put medicated cream and a bandage on the wound as told by your doctor.   Change the bandage if it gets wet, dirty, or starts to smell.   Take showers. Do not take baths, swim, or do anything that puts your wound under water.   Rest and raise (elevate) the wound until the pain and puffiness (swelling) are better.   Keep all doctor visits as told.  GET HELP RIGHT AWAY IF:   Yellowish-white fluid (pus) comes from the wound.   Medicine does not lessen your pain.   There is a red streak going away from the wound.   You cannot move your finger or toe.   You have a fever.  MAKE SURE YOU:   Understand these instructions.   Will watch your condition.   Will get help right away if you are not doing well or get worse.  Document Released: 07/12/2008 Document Revised: 09/22/2011 Document Reviewed: 02/06/2011 [facility] Patient Information 2012 [place], [place].

## 2012-06-19 NOTE — ED Provider Notes (Signed)
History     CSN: [id number]  Arrival date & time 06/19/12  1041   First MD Initiated Contact with Patient 06/19/12 1218      Chief Complaint  Patient presents with  . Laceration    (Consider location/radiation/quality/duration/timing/severity/associated sxs/prior treatment) Patient is a 21 y.o. female presenting with skin laceration. The history is provided by the patient. No language interpreter was used.  Laceration  The incident occurred yesterday. Size: 5 mm. The laceration mechanism was a a clean knife. The pain is at a severity of 2/10. The pain is mild. The pain has been constant since onset. She reports no foreign bodies present.  Pt cut finger at work  History reviewed. No pertinent past medical history.  History reviewed. No pertinent past surgical history.  Family History  Problem Relation Age of Onset  . Hypertension Mother     History  Substance Use Topics  . Smoking status: Never Smoker   . Smokeless tobacco: Never Used  . Alcohol Use: No    OB History    Grav Para Term Preterm Abortions TAB SAB Ect Mult Living                  Review of Systems  Skin: Positive for wound.  All other systems reviewed and are negative.    Allergies  Vancomycin  Home Medications  No current outpatient prescriptions on file.  BP 102/62  Pulse 62  Temp 97.9 F (36.6 C) (Oral)  Resp 16  SpO2 98%  LMP 06/12/2012  Physical Exam  Nursing note and vitals reviewed. Constitutional: She is oriented to person, place, and time.  Musculoskeletal: She exhibits tenderness.       4mm laceration left index finger  Neurological: She is alert and oriented to person, place, and time.  Skin: Skin is warm.    ED Course  Procedures (including critical care time)  Labs Reviewed - No data to display No results found.   No diagnosis found.    MDM  Tetanus, steri strip        Lonia Skinner [place], [place] 06/19/12 1227

## 2012-06-19 NOTE — ED Notes (Signed)
No pcp, unknown tetanus status

## 2012-06-20 NOTE — ED Provider Notes (Signed)
Medical screening examination/treatment/procedure(s) were performed by resident physician or non-physician practitioner and as supervising physician I was immediately available for consultation/collaboration.   Barkley Bruns MD.    Linna Hoff, MD 06/20/12 2137

## 2013-01-07 ENCOUNTER — Emergency Department (HOSPITAL_COMMUNITY)
Admission: EM | Admit: 2013-01-07 | Discharge: 2013-01-07 | Disposition: A | Discharge status: Home / Self Care | Payer: Self-pay | Attending: Emergency Medicine | Admitting: Emergency Medicine

## 2013-01-07 ENCOUNTER — Encounter (HOSPITAL_COMMUNITY): Payer: Self-pay | Admitting: Emergency Medicine

## 2013-01-07 DIAGNOSIS — Z3202 Encounter for pregnancy test, result negative: Secondary | ICD-10-CM | POA: Insufficient documentation

## 2013-01-07 DIAGNOSIS — R109 Unspecified abdominal pain: Secondary | ICD-10-CM | POA: Insufficient documentation

## 2013-01-07 DIAGNOSIS — N946 Dysmenorrhea, unspecified: Secondary | ICD-10-CM | POA: Insufficient documentation

## 2013-01-07 LAB — CBC WITH DIFFERENTIAL/PLATELET
Basophils Absolute: 0 10*3/uL (ref 0.0–0.1)
Eosinophils Relative: 0 % (ref 0–5)
HCT: 40.5 % (ref 36.0–46.0)
Lymphocytes Relative: 9 % — ABNORMAL LOW (ref 12–46)
Lymphs Abs: 1.2 10*3/uL (ref 0.7–4.0)
MCH: 31.1 pg (ref 26.0–34.0)
MCV: 86.4 fL (ref 78.0–100.0)
Monocytes Absolute: 0.8 10*3/uL (ref 0.1–1.0)
RDW: 12.4 % (ref 11.5–15.5)
WBC: 13.4 10*3/uL — ABNORMAL HIGH (ref 4.0–10.5)

## 2013-01-07 LAB — BASIC METABOLIC PANEL
CO2: 24 mEq/L (ref 19–32)
Calcium: 10.7 mg/dL — ABNORMAL HIGH (ref 8.4–10.5)
Creatinine, Ser: 0.75 mg/dL (ref 0.50–1.10)
Glucose, Bld: 115 mg/dL — ABNORMAL HIGH (ref 70–99)

## 2013-01-07 MED ORDER — PROMETHAZINE HCL 25 MG PO TABS: 25.0000 mg | ORAL_TABLET | Freq: Four times a day (QID) | ORAL | Status: DC | PRN

## 2013-01-07 MED ORDER — DICLOFENAC SODIUM 75 MG PO TBEC: 75.0000 mg | DELAYED_RELEASE_TABLET | Freq: Two times a day (BID) | ORAL | Status: DC

## 2013-01-07 MED ORDER — ONDANSETRON HCL 4 MG/2ML IJ SOLN
4.0000 mg | Freq: Once | INTRAMUSCULAR | Status: AC
  Administered 2013-01-07: 4 mg via INTRAVENOUS
  Filled 2013-01-07: qty 2

## 2013-01-07 MED ORDER — KETOROLAC TROMETHAMINE 60 MG/2ML IM SOLN
60.0000 mg | Freq: Once | INTRAMUSCULAR | Status: AC
  Administered 2013-01-07: 60 mg via INTRAMUSCULAR
  Filled 2013-01-07: qty 2

## 2013-01-07 NOTE — ED Notes (Signed)
Onset one day ago general abdominal pain with nausea and emesis multiple episodes. States LMP 01/06/2013.

## 2013-01-07 NOTE — ED Provider Notes (Signed)
  Medical screening examination/treatment/procedure(s) were performed by non-physician practitioner and as supervising physician I was immediately available for consultation/collaboration.    Gerhard Munch, MD 01/07/13 1606

## 2013-01-07 NOTE — ED Notes (Signed)
Pt states that this happens when she has her period and she started yesterday n/v/

## 2013-01-07 NOTE — ED Provider Notes (Signed)
History     CSN: [id number]  Arrival date & time 01/07/13  3086   First MD Initiated Contact with Patient 01/07/13 1031      Chief Complaint  Patient presents with  . Emesis  . Abdominal Pain    (Consider location/radiation/quality/duration/timing/severity/associated sxs/prior treatment) Patient is a 22 y.o. female presenting with vomiting and abdominal pain. The history is provided by the patient.  Emesis Severity:  Moderate Duration:  1 day Timing:  Constant Quality:  Undigested food Progression:  Worsening Chronicity:  Recurrent Recent urination:  Normal Associated symptoms: abdominal pain   Risk factors: not pregnant now   Abdominal Pain Associated symptoms: vomiting   Pt reports she has nausea and vomiting with her period.   Pt reports starting her period yesterday.  History reviewed. No pertinent past medical history.  History reviewed. No pertinent past surgical history.  Family History  Problem Relation Age of Onset  . Hypertension Mother     History  Substance Use Topics  . Smoking status: Never Smoker   . Smokeless tobacco: Never Used  . Alcohol Use: No    OB History   Grav Para Term Preterm Abortions TAB SAB Ect Mult Living                  Review of Systems  Gastrointestinal: Positive for vomiting and abdominal pain.  All other systems reviewed and are negative.    Allergies  Vancomycin  Home Medications   Current Outpatient Rx  Name  Route  Sig  Dispense  Refill  . Acetaminophen-Caff-Pyrilamine (MIDOL COMPLETE) 500-60-15 MG TABS   Oral   Take 1 tablet by mouth 2 (two) times daily as needed (pain).         Marland Kitchen ibuprofen (ADVIL,MOTRIN) 200 MG tablet   Oral   Take 400 mg by mouth every 6 (six) hours as needed for pain.           BP 117/68  Pulse 99  Temp(Src) 97.3 F (36.3 C) (Oral)  Resp 20  Ht 5\' 1"  (1.549 m)  Wt 100 lb (45.36 kg)  BMI 18.9 kg/m2  SpO2 99%  Physical Exam  Nursing note and vitals  reviewed. Constitutional: She appears well-developed and well-nourished.  HENT:  Head: Normocephalic.  Nose: Nose normal.  Mouth/Throat: Oropharynx is clear and moist.  Eyes: Conjunctivae and EOM are normal. Pupils are equal, round, and reactive to light.  Neck: Normal range of motion. Neck supple.  Cardiovascular: Normal rate.   Pulmonary/Chest: Effort normal and breath sounds normal.  Abdominal: Soft. There is no tenderness.  Musculoskeletal: Normal range of motion.  Neurological: She is alert.  Skin: Skin is warm.  Psychiatric: She has a normal mood and affect.    ED Course  Procedures (including critical care time)  Labs Reviewed  CBC WITH DIFFERENTIAL - Abnormal; Notable for the following:    WBC 13.4 (*)    Neutrophils Relative 85 (*)    Neutro Abs 11.4 (*)    Lymphocytes Relative 9 (*)    All other components within normal limits  BASIC METABOLIC PANEL - Abnormal; Notable for the following:    Glucose, Bld 115 (*)    Calcium 10.7 (*)    All other components within normal limits  PREGNANCY, URINE   No results found.   No diagnosis found.    MDM  upt negative,   Pt given torodol and zofran IM.    I advised schedule gyn appointment for evaluation.   Pt  given rx for voltaren and phenergan.        Lonia Skinner [place], PA-C 01/07/13 1223

## 2013-05-20 ENCOUNTER — Encounter (HOSPITAL_COMMUNITY): Payer: Self-pay | Admitting: *Deleted

## 2013-05-20 ENCOUNTER — Inpatient Hospital Stay (HOSPITAL_COMMUNITY)
Admission: AD | Admit: 2013-05-20 | Discharge: 2013-05-20 | Disposition: A | Discharge status: Home / Self Care | Payer: Self-pay | Source: Ambulatory Visit | Attending: Obstetrics and Gynecology | Admitting: Obstetrics and Gynecology

## 2013-05-20 ENCOUNTER — Inpatient Hospital Stay (HOSPITAL_COMMUNITY): Payer: Self-pay

## 2013-05-20 DIAGNOSIS — A599 Trichomoniasis, unspecified: Secondary | ICD-10-CM

## 2013-05-20 DIAGNOSIS — G8929 Other chronic pain: Secondary | ICD-10-CM | POA: Insufficient documentation

## 2013-05-20 DIAGNOSIS — A5901 Trichomonal vulvovaginitis: Secondary | ICD-10-CM | POA: Insufficient documentation

## 2013-05-20 DIAGNOSIS — R079 Chest pain, unspecified: Secondary | ICD-10-CM | POA: Insufficient documentation

## 2013-05-20 DIAGNOSIS — N949 Unspecified condition associated with female genital organs and menstrual cycle: Secondary | ICD-10-CM | POA: Insufficient documentation

## 2013-05-20 HISTORY — DX: Other specified health status: Z78.9

## 2013-05-20 LAB — CBC
Hemoglobin: 13.3 g/dL (ref 12.0–15.0)
MCH: 30.6 pg (ref 26.0–34.0)
MCV: 89.4 fL (ref 78.0–100.0)
RBC: 4.35 MIL/uL (ref 3.87–5.11)

## 2013-05-20 LAB — COMPREHENSIVE METABOLIC PANEL
ALT: 30 U/L (ref 0–35)
CO2: 23 mEq/L (ref 19–32)
Calcium: 10.5 mg/dL (ref 8.4–10.5)
Creatinine, Ser: 0.89 mg/dL (ref 0.50–1.10)
GFR calc Af Amer: >90 mL/min (ref >90)
GFR calc non Af Amer: >90 mL/min (ref >90)
Glucose, Bld: 86 mg/dL (ref 70–99)
Sodium: 141 mEq/L (ref 135–145)

## 2013-05-20 LAB — URINALYSIS, ROUTINE W REFLEX MICROSCOPIC
Glucose, UA: NEGATIVE mg/dL
Ketones, ur: 15 mg/dL — AB
pH: 6 (ref 5.0–8.0)

## 2013-05-20 LAB — WET PREP, GENITAL: Clue Cells Wet Prep HPF POC: NONE SEEN

## 2013-05-20 LAB — URINE MICROSCOPIC-ADD ON

## 2013-05-20 MED ORDER — KETOROLAC TROMETHAMINE 60 MG/2ML IM SOLN
60.0000 mg | Freq: Once | INTRAMUSCULAR | Status: AC
  Administered 2013-05-20: 60 mg via INTRAMUSCULAR
  Filled 2013-05-20: qty 2

## 2013-05-20 MED ORDER — IBUPROFEN 600 MG PO TABS: 600.0000 mg | ORAL_TABLET | Freq: Four times a day (QID) | ORAL | Status: DC | PRN

## 2013-05-20 MED ORDER — METRONIDAZOLE 500 MG PO TABS
2000.0000 mg | ORAL_TABLET | Freq: Once | ORAL | Status: AC
  Administered 2013-05-20: 2000 mg via ORAL
  Filled 2013-05-20: qty 4

## 2013-05-20 MED ORDER — GI COCKTAIL ~~LOC~~
30.0000 mL | Freq: Once | ORAL | Status: AC
  Administered 2013-05-20: 30 mL via ORAL
  Filled 2013-05-20: qty 30

## 2013-05-20 MED ORDER — ONDANSETRON 4 MG PO TBDP
4.0000 mg | ORAL_TABLET | Freq: Once | ORAL | Status: AC
  Administered 2013-05-20: 4 mg via ORAL
  Filled 2013-05-20: qty 1

## 2013-05-20 NOTE — MAU Note (Signed)
Pt brought from lobby in wheelchair, pt lying down in the wheelchair.  Pt states she started her period 2 days ago, began having chest pain & vomitting since yesterday.  Chest pain is mid-sternal, hurts worse with a deep breath, does not radiate, also hurts with a cough.  Pt states her R hand "feels funny", is unable to describe what this means.

## 2013-05-20 NOTE — MAU Provider Note (Signed)
History     CSN: [id number]  Arrival date and time: 05/20/13 1657   First Provider Initiated Contact with Patient 05/20/13 1733      Chief Complaint  Patient presents with  . Chest Pain  . Emesis   HPI  Pt is a G0P0 that started her period 2 days ago.  Reports vomiting that started yesterday.  Vomited every time she has taken anything PO for past 24 hours.  Now feeling pain in midchest.  Pain increases with deep breathing and coughing and does not radiate.  Reports increased vomiting and pain with each menstrual cycle.  Also reports right hand feeling funning, slightly numb.    Addendum:  After family members leave the room pt admits to being a lesbian and participation in female-to-female sex.  Believes she got STI from previous partner.  Has not been active with current partner.     Past Medical History  Diagnosis Date  . Medical history non-contributory     Past Surgical History  Procedure Laterality Date  . No past surgeries      Family History  Problem Relation Age of Onset  . Hypertension Mother     History  Substance Use Topics  . Smoking status: Never Smoker   . Smokeless tobacco: Never Used  . Alcohol Use: No    Allergies:  Allergies  Allergen Reactions  . Vancomycin Itching    Prescriptions prior to admission  Medication Sig Dispense Refill  . ibuprofen (ADVIL,MOTRIN) 200 MG tablet Take 400 mg by mouth every 6 (six) hours as needed for pain.        ROS Physical Exam   Blood pressure 103/84, pulse 62, temperature 97.3 F (36.3 C), temperature source Oral, resp. rate 22, last menstrual period 05/18/2013, SpO2 100.00%.  Physical Exam  Constitutional: She is oriented to person, place, and time. She appears well-developed and well-nourished. She appears distressed (pt balled up in wheelchair).  Pt laying face down in bed.    HENT:  Head: Normocephalic.  Neck: Normal range of motion. Neck supple.  Cardiovascular: Normal rate, regular rhythm and  normal heart sounds.   Respiratory: Effort normal and breath sounds normal.  GI: Soft. She exhibits no mass. There is tenderness (midpelvis). There is no guarding.  Genitourinary: Uterus is tender. There is bleeding (negative clots, moderate) around the vagina. No erythema around the vagina. Vaginal discharge ( +fishy odor) found.  Musculoskeletal: Normal range of motion. She exhibits no edema.  Neurological: She is alert and oriented to person, place, and time. She has normal reflexes.  Skin: Skin is warm and dry.    MAU Course  Procedures Results for orders placed during the hospital encounter of 05/20/13 (from the past 24 hour(s))  URINALYSIS, ROUTINE W REFLEX MICROSCOPIC     Status: Abnormal   Collection Time    05/20/13  5:20 PM      Result Value Range   Color, Urine AMBER (*) YELLOW   APPearance CLOUDY (*) CLEAR   Specific Gravity, Urine >1.030 (*) 1.005 - 1.030   pH 6.0  5.0 - 8.0   Glucose, UA NEGATIVE  NEGATIVE mg/dL   Hgb urine dipstick LARGE (*) NEGATIVE   Bilirubin Urine SMALL (*) NEGATIVE   Ketones, ur 15 (*) NEGATIVE mg/dL   Protein, ur 413 (*) NEGATIVE mg/dL   Urobilinogen, UA 1.0  0.0 - 1.0 mg/dL   Nitrite NEGATIVE  NEGATIVE   Leukocytes, UA TRACE (*) NEGATIVE  URINE MICROSCOPIC-ADD ON  Status: Abnormal   Collection Time    05/20/13  5:20 PM      Result Value Range   Squamous Epithelial / LPF FEW (*) RARE   WBC, UA 3-6  <3 WBC/hpf   RBC / HPF TOO NUMEROUS TO COUNT  <3 RBC/hpf   Bacteria, UA MANY (*) RARE   Urine-Other MUCOUS PRESENT    POCT PREGNANCY, URINE     Status: None   Collection Time    05/20/13  5:30 PM      Result Value Range   Preg Test, Ur NEGATIVE  NEGATIVE  CBC     Status: Abnormal   Collection Time    05/20/13  5:39 PM      Result Value Range   WBC 11.4 (*) 4.0 - 10.5 K/uL   RBC 4.35  3.87 - 5.11 MIL/uL   Hemoglobin 13.3  12.0 - 15.0 g/dL   HCT 29.5  62.1 - 30.8 %   MCV 89.4  78.0 - 100.0 fL   MCH 30.6  26.0 - 34.0 pg   MCHC 34.2   30.0 - 36.0 g/dL   RDW 65.7  84.6 - 96.2 %   Platelets 291  150 - 400 K/uL   Wet Prep - +trich, neg yeast, neg clue  Ultrasound: IMPRESSION:  Normal transabdominal pelvic ultrasound.   IM Toradol; PO Zofran 1840 Pt reports decrease in both pain and nausea. 1845 Pelvic exam completed without difficulty 2010 Received 2 GM Flagyl in MAU  Assessment and Plan  Trichomoniasis Chronic Pelvic Pain - ? Endometriosis  Plan: DC to home RX Ibuprofen 600 mg Advised partner treatment Refer to [facility] for endometriosis evaluation  [facility] 05/20/2013, 5:34 PM

## 2013-05-22 LAB — URINE CULTURE: Colony Count: 35000

## 2013-05-22 NOTE — MAU Provider Note (Signed)
Attestation of Attending Supervision of Advanced Practitioner (CNM/NP): Evaluation and management procedures were performed by the Advanced Practitioner under my supervision and collaboration.  I have reviewed the Advanced Practitioner's note and chart, and I agree with the management and plan.  Addilee Neu 05/22/2013 10:57 AM

## 2013-07-01 ENCOUNTER — Ambulatory Visit (INDEPENDENT_AMBULATORY_CARE_PROVIDER_SITE_OTHER): Payer: Self-pay | Admitting: Obstetrics & Gynecology

## 2013-07-01 ENCOUNTER — Encounter: Payer: Self-pay | Admitting: Obstetrics & Gynecology

## 2013-07-01 VITALS — Temp 99.1°F | Ht 60.0 in | Wt 103.1 lb

## 2013-07-01 DIAGNOSIS — N946 Dysmenorrhea, unspecified: Secondary | ICD-10-CM

## 2013-07-01 DIAGNOSIS — Z3009 Encounter for other general counseling and advice on contraception: Secondary | ICD-10-CM

## 2013-07-01 MED ORDER — METOCLOPRAMIDE HCL 5 MG/5ML PO SOLN: 10.0000 mg | Freq: Three times a day (TID) | ORAL | Status: DC

## 2013-07-01 MED ORDER — LEVONORGEST-ETH ESTRAD 91-DAY 0.15-0.03 MG PO TABS: 1.0000 | ORAL_TABLET | Freq: Every day | ORAL | Status: DC

## 2013-07-01 NOTE — Patient Instructions (Signed)
Dysmenorrhea  Menstrual pain is caused by the muscles of the uterus tightening (contracting) during a menstrual period. The muscles of the uterus contract due to the chemicals in the uterine lining.  Primary dysmenorrhea is menstrual cramps that last a couple of days when you start having menstrual periods or soon after. This often begins after a teenager starts having her period. As a woman gets older or has a baby, the cramps will usually lesson or disappear.  Secondary dysmenorrhea begins later in life, lasts longer, and the pain may be stronger than primary dysmenorrhea. The pain may start before the period and last a few days after the period. This type of dysmenorrhea is usually caused by an underlying problem such as:   The tissue lining the uterus grows outside of the uterus in other areas of the body (endometriosis).   The endometrial tissue, which normally lines the uterus, is found in or grows into the muscular walls of the uterus (adenomyosis).   The pelvic blood vessels are engorged with blood just before the menstrual period (pelvic congestive syndrome).   Overgrowth of cells in the lining of the uterus or cervix (polyps of the uterus or cervix).   Falling down of the uterus (prolapse) because of loose or stretched ligaments.   Depression.   Bladder problems, infection, or inflammation.   Problems with the intestine, a tumor, or irritable bowel syndrome.   Cancer of the female organs or bladder.   A severely tipped uterus.   A very tight opening or closed cervix.   Noncancerous tumors of the uterus (fibroids).   Pelvic inflammatory disease (PID).   Pelvic scarring (adhesions) from a previous surgery.   Ovarian cyst.   An intrauterine device (IUD) used for birth control.  CAUSES   The cause of menstrual pain is often unknown.  SYMPTOMS    Cramping or throbbing pain in your lower abdomen.   Sometimes, a woman may also experience headaches.   Lower back pain.   Feeling sick to your  stomach (nausea) or vomiting.   Diarrhea.   Sweating or dizziness.  DIAGNOSIS   A diagnosis is based on your history, symptoms, physical examination, diagnostic tests, or procedures. Diagnostic tests or procedures may include:   Blood tests.   An ultrasound.   An examination of the lining of the uterus (dilation and curettage, D&C).   An examination inside your abdomen or pelvis with a scope (laparoscopy).   X-rays.   CT Scan.   MRI.   An examination inside the bladder with a scope (cystoscopy).   An examination inside the intestine or stomach with a scope (colonoscopy, gastroscopy).  TREATMENT   Treatment depends on the cause of the dysmenorrhea. Treatment may include:   Pain medicine prescribed by your caregiver.   Birth control pills.   Hormone replacement therapy.   Nonsteroidal anti-inflammatory drugs (NSAIDs). These may help stop the production of prostaglandins.   An IUD with progesterone hormone in it.   Acupuncture.   Surgery to remove adhesions, endometriosis, ovarian cyst, or fibroids.   Removal of the uterus (hysterectomy).   Progesterone shots to stop the menstrual period.   Cutting the nerves on the sacrum that go to the female organs (presacral neurectomy).   Electric currant to the sacral nerves (sacral nerve stimulation).   Antidepressant medicine.   Psychiatric therapy, counseling, or group therapy.   Exercise and physical therapy.   Meditation and yoga therapy.  HOME CARE INSTRUCTIONS    Only take over-the-counter   or prescription medicines for pain, discomfort, or fever as directed by your caregiver.   Place a heating pad or hot water bottle on your lower back or abdomen. Do not sleep with the heating pad.   Use aerobic exercises, walking, swimming, biking, and other exercises to help lessen the cramping.   Massage to the lower back or abdomen may help.   Stop smoking.   Avoid alcohol and caffeine.   Yoga, meditation, or acupuncture may help.  SEEK MEDICAL CARE IF:     The pain does not get better with medicine.   You have pain with sexual intercourse.  SEEK IMMEDIATE MEDICAL CARE IF:    Your pain increases and is not controlled with medicines.   You have a fever.   You develop nausea or vomiting with your period not controlled with medicine.   You have abnormal vaginal bleeding with your period.   You pass out.  MAKE SURE YOU:    Understand these instructions.   Will watch your condition.   Will get help right away if you are not doing well or get worse.  Document Released: 10/03/2005 Document Revised: 12/26/2011 Document Reviewed: 01/19/2009  [employer] Patient Information 2014 [employer].

## 2013-07-01 NOTE — Progress Notes (Signed)
Patient ID: [name], female   DOB: 1991-07-11, 22 y.o.   MRN: [id number]  Chief Complaint  Patient presents with  . Pelvic Pain    HPI [name] is a 22 y.o. female.  G0P0 Patient's last menstrual period was 06/17/2013. Long history of dysmenorrhea and nausea, has used OCP before and sx were not well controlled. No sexually active, in same sex relationship  HPI  Past Medical History  Diagnosis Date  . Medical history non-contributory     Past Surgical History  Procedure Laterality Date  . No past surgeries      Family History  Problem Relation Age of Onset  . Hypertension Mother     Social History History  Substance Use Topics  . Smoking status: Never Smoker   . Smokeless tobacco: Never Used  . Alcohol Use: No    Allergies  Allergen Reactions  . Vancomycin Itching    Current Outpatient Prescriptions  Medication Sig Dispense Refill  . ibuprofen (ADVIL,MOTRIN) 600 MG tablet Take 1 tablet (600 mg total) by mouth every 6 (six) hours as needed for pain.  30 tablet  0  . levonorgestrel-ethinyl estradiol (SEASONALE,INTROVALE,JOLESSA) 0.15-0.03 MG tablet Take 1 tablet by mouth daily.  1 Package  4   No current facility-administered medications for this visit.    Review of Systems Review of Systems  Constitutional: Negative for fever.  Genitourinary: Positive for pelvic pain. Negative for vaginal bleeding and vaginal discharge.    Temperature 99.1 F (37.3 C), temperature source Oral, height 5' (1.524 m), weight 103 lb 1.6 oz (46.766 kg), last menstrual period 06/17/2013.  Physical Exam Physical Exam  Constitutional: She is oriented to person, place, and time. No distress.  slender  Abdominal: Soft. She exhibits no distension and no mass. There is no tenderness.  Genitourinary: Vagina normal and uterus normal. No vaginal discharge found.  Pap done, no mass or tenderness  Neurological: She is alert and oriented to person, place, and time.   Skin: Skin is warm and dry.  Psychiatric: She has a normal mood and affect. Her behavior is normal.    Data Reviewed [facility] note  Assessment    Dysmenorrhea with nausea     Plan    Seasonale, Reglan prn, RTC 3 months        ARNOLD,JAMES 07/01/2013, 4:44 PM

## 2013-11-25 ENCOUNTER — Encounter (HOSPITAL_COMMUNITY): Payer: Self-pay | Admitting: Emergency Medicine

## 2013-11-25 ENCOUNTER — Emergency Department (HOSPITAL_COMMUNITY): Payer: Self-pay

## 2013-11-25 ENCOUNTER — Emergency Department (HOSPITAL_COMMUNITY)
Admission: EM | Admit: 2013-11-25 | Discharge: 2013-11-25 | Disposition: A | Discharge status: Home / Self Care | Payer: Self-pay | Attending: Emergency Medicine | Admitting: Emergency Medicine

## 2013-11-25 DIAGNOSIS — Z3202 Encounter for pregnancy test, result negative: Secondary | ICD-10-CM | POA: Insufficient documentation

## 2013-11-25 DIAGNOSIS — R112 Nausea with vomiting, unspecified: Secondary | ICD-10-CM | POA: Insufficient documentation

## 2013-11-25 DIAGNOSIS — F411 Generalized anxiety disorder: Secondary | ICD-10-CM | POA: Insufficient documentation

## 2013-11-25 DIAGNOSIS — R0602 Shortness of breath: Secondary | ICD-10-CM | POA: Insufficient documentation

## 2013-11-25 DIAGNOSIS — N946 Dysmenorrhea, unspecified: Secondary | ICD-10-CM | POA: Insufficient documentation

## 2013-11-25 DIAGNOSIS — R064 Hyperventilation: Secondary | ICD-10-CM | POA: Insufficient documentation

## 2013-11-25 DIAGNOSIS — R0682 Tachypnea, not elsewhere classified: Secondary | ICD-10-CM | POA: Insufficient documentation

## 2013-11-25 DIAGNOSIS — R079 Chest pain, unspecified: Secondary | ICD-10-CM | POA: Insufficient documentation

## 2013-11-25 LAB — URINALYSIS, ROUTINE W REFLEX MICROSCOPIC
Glucose, UA: NEGATIVE mg/dL
Ketones, ur: 40 mg/dL — AB
Nitrite: NEGATIVE
PROTEIN: 100 mg/dL — AB
SPECIFIC GRAVITY, URINE: 1.037 — AB (ref 1.005–1.030)
UROBILINOGEN UA: 0.2 mg/dL (ref 0.0–1.0)
pH: 6 (ref 5.0–8.0)

## 2013-11-25 LAB — CBC WITH DIFFERENTIAL/PLATELET
BASOS ABS: 0 10*3/uL (ref 0.0–0.1)
Basophils Relative: 0 % (ref 0–1)
Eosinophils Absolute: 0 10*3/uL (ref 0.0–0.7)
Eosinophils Relative: 0 % (ref 0–5)
HCT: 41.5 % (ref 36.0–46.0)
Hemoglobin: 14.4 g/dL (ref 12.0–15.0)
LYMPHS PCT: 14 % (ref 12–46)
Lymphs Abs: 1.3 10*3/uL (ref 0.7–4.0)
MCH: 31.5 pg (ref 26.0–34.0)
MCHC: 34.7 g/dL (ref 30.0–36.0)
MCV: 90.8 fL (ref 78.0–100.0)
Monocytes Absolute: 0.3 10*3/uL (ref 0.1–1.0)
Monocytes Relative: 3 % (ref 3–12)
NEUTROS ABS: 7.7 10*3/uL (ref 1.7–7.7)
NEUTROS PCT: 83 % — AB (ref 43–77)
PLATELETS: 333 10*3/uL (ref 150–400)
RBC: 4.57 MIL/uL (ref 3.87–5.11)
RDW: 13 % (ref 11.5–15.5)
WBC: 9.2 10*3/uL (ref 4.0–10.5)

## 2013-11-25 LAB — COMPREHENSIVE METABOLIC PANEL
ALK PHOS: 65 U/L (ref 39–117)
ALT: 25 U/L (ref 0–35)
AST: 31 U/L (ref 0–37)
Albumin: 5.2 g/dL (ref 3.5–5.2)
BILIRUBIN TOTAL: 1.1 mg/dL (ref 0.3–1.2)
BUN: 20 mg/dL (ref 6–23)
CALCIUM: 10.2 mg/dL (ref 8.4–10.5)
CHLORIDE: 97 meq/L (ref 96–112)
CO2: 25 meq/L (ref 19–32)
Creatinine, Ser: 0.89 mg/dL (ref 0.50–1.10)
GFR calc Af Amer: >90 mL/min (ref >90)
GFR, EST NON AFRICAN AMERICAN: 90 mL/min — AB (ref >90)
Glucose, Bld: 104 mg/dL — ABNORMAL HIGH (ref 70–99)
POTASSIUM: 3.6 meq/L — AB (ref 3.7–5.3)
SODIUM: 138 meq/L (ref 137–147)
Total Protein: 8.7 g/dL — ABNORMAL HIGH (ref 6.0–8.3)

## 2013-11-25 LAB — URINE MICROSCOPIC-ADD ON

## 2013-11-25 LAB — POCT PREGNANCY, URINE: Preg Test, Ur: NEGATIVE

## 2013-11-25 MED ORDER — LORAZEPAM 2 MG/ML IJ SOLN
1.0000 mg | Freq: Once | INTRAMUSCULAR | Status: AC
  Administered 2013-11-25: 1 mg via INTRAVENOUS
  Filled 2013-11-25: qty 1

## 2013-11-25 MED ORDER — ONDANSETRON 4 MG PO TBDP: 4.0000 mg | ORAL_TABLET | Freq: Three times a day (TID) | ORAL | Status: DC | PRN

## 2013-11-25 MED ORDER — PROMETHAZINE HCL 25 MG/ML IJ SOLN
25.0000 mg | Freq: Once | INTRAMUSCULAR | Status: AC
  Administered 2013-11-25: 25 mg via INTRAVENOUS
  Filled 2013-11-25: qty 1

## 2013-11-25 MED ORDER — ONDANSETRON 8 MG PO TBDP
8.0000 mg | ORAL_TABLET | Freq: Once | ORAL | Status: AC
  Administered 2013-11-25: 8 mg via ORAL
  Filled 2013-11-25: qty 1

## 2013-11-25 MED ORDER — SODIUM CHLORIDE 0.9 % IV BOLUS (SEPSIS)
1000.0000 mL | INTRAVENOUS | Status: AC
  Administered 2013-11-25: 1000 mL via INTRAVENOUS

## 2013-11-25 NOTE — ED Notes (Signed)
PA at bedside.

## 2013-11-25 NOTE — ED Provider Notes (Signed)
CSN: [id number]     Arrival date & time 11/25/13  1646 History   First MD Initiated Contact with Patient 11/25/13 2004     Chief Complaint  Patient presents with  . Emesis     (Consider location/radiation/quality/duration/timing/severity/associated sxs/prior Treatment) HPI Pt is a 23yo female with hx of emesis associated with menstrual cycle presenting tonight with vomiting x24hrs, unable to tolerate PO fluid.  Pt states "I have vomited so much that my chest hurts."  Pt states this occurs on a monthly basis with her menstrual cycle, reports her cycle started yesterday and that is when this most recent episode of vomiting started. 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 Denies abdominal pain. Denies urinary of vaginal symtpoms Denies fever or diarrhea. Denies sick contacts or recent travel. States she has been to ED several times for same but has not f/u with GI or a GYN.  No hx of abdominal surgeries.   Past Medical History  Diagnosis Date  . Medical history non-contributory    Past Surgical History  Procedure Laterality Date  . No past surgeries     Family History  Problem Relation Age of Onset  . Hypertension Mother    History  Substance Use Topics  . Smoking status: Never Smoker   . Smokeless tobacco: Never Used  . Alcohol Use: No   OB History   Grav Para Term Preterm Abortions TAB SAB Ect Mult Living   0              Review of Systems  Constitutional: Negative for fever and chills.  Respiratory: Positive for shortness of breath. Negative for cough.   Cardiovascular: Positive for chest pain.  Gastrointestinal: Positive for nausea and vomiting. Negative for abdominal pain, diarrhea and constipation.  Genitourinary: Negative for dysuria, urgency, flank pain,  decreased urine volume, vaginal discharge, vaginal pain and pelvic pain.  Musculoskeletal: Negative for back pain.  All other systems reviewed and are negative.      Allergies  Vancomycin  Home Medications   Current Outpatient Rx  Name  Route  Sig  Dispense  Refill  . ondansetron (ZOFRAN ODT) 4 MG disintegrating tablet   Oral   Take 1 tablet (4 mg total) by mouth every 8 (eight) hours as needed for nausea.   10 tablet   0    BP 99/55  Pulse 70  Temp(Src) 98.4 F (36.9 C) (Oral)  Resp 18  SpO2 99%  LMP 11/25/2013 Physical Exam  Nursing note and vitals reviewed. Constitutional: She appears well-developed and well-nourished.  Anxious, hyperventilating   HENT:  Head: Normocephalic and atraumatic.  Eyes: Conjunctivae are normal. No scleral icterus.  Neck: Normal range of motion. Neck supple.  Cardiovascular: Normal rate, regular rhythm and normal heart sounds.   Pulmonary/Chest: Breath sounds normal. Tachypnea noted. No respiratory distress. She has no wheezes. She has no rales. She exhibits no tenderness.  Tachypnea. Lungs: CTAB  Abdominal: Soft. Bowel sounds are normal. She exhibits no distension and no mass. There is no tenderness. There is no rebound and no guarding.  Soft, non-distended, non-tender.  Musculoskeletal: Normal range of motion.  Neurological: She is alert.  Skin: Skin is warm.  Psychiatric: Her mood appears anxious.    ED Course  Procedures (including critical care time) Labs Review Labs Reviewed  URINALYSIS, ROUTINE W REFLEX MICROSCOPIC - Abnormal; Notable for the following:    Color, Urine AMBER (*)    APPearance CLOUDY (*)    Specific Gravity, Urine 1.037 (*)    Hgb urine dipstick LARGE (*)    Bilirubin Urine SMALL (*)    Ketones, ur 40 (*)    Protein, ur 100 (*)    Leukocytes, UA SMALL (*)    All other components within normal limits  CBC WITH DIFFERENTIAL - Abnormal; Notable for the following:    Neutrophils Relative % 83 (*)    All  other components within normal limits  COMPREHENSIVE METABOLIC PANEL - Abnormal; Notable for the following:    Potassium 3.6 (*)    Glucose, Bld 104 (*)    Total Protein 8.7 (*)    GFR calc non Af Amer 90 (*)    All other components within normal limits  URINE MICROSCOPIC-ADD ON - Abnormal; Notable for the following:    Bacteria, UA MANY (*)    All other components within normal limits  URINE CULTURE  POCT PREGNANCY, URINE   Imaging Review Dg Abd Acute W/chest  11/25/2013   CLINICAL DATA:  Emesis, chest pain.  EXAM: ACUTE ABDOMEN SERIES (ABDOMEN 2 VIEW & CHEST 1 VIEW)  COMPARISON:  September 22, 2011.  FINDINGS: There is no evidence of dilated bowel loops or free intraperitoneal air. No radiopaque calculi or other significant radiographic abnormality is seen. Heart size and mediastinal contours are within normal limits. Both lungs are clear.  IMPRESSION: Negative abdominal radiographs.  No acute cardiopulmonary disease.   Electronically Signed   By: Roque Lias M.D.   On: 11/25/2013 21:24    EKG Interpretation   None       MDM   Final diagnoses:  1. Nausea and vomiting 2. Dysmenorrhea     Pt with hx of vomiting during menstrual cycles presenting to ED with same tonight. Pt c/o chest pain and SOB due to 24hours of vomiting. Vitals: unremarkable. O2 in triage 100%.  On exam upon arrival to exam room, pt appears anxious and tachypnic. Lungs: CTAB. Abd: soft, non-distended non-tender. Not concerned for surgical abdomen.   Pt denies abdominal pain.  Pt's mother reports hx of perforated lung after last vomiting incident.  No records found on pt for pneumothorax.  Will get acute abd with chest.    Labs: CBC, CMP: unremarkable. UA: hematuria, pt is on menstrual cycle, started yesterday.    Pt given zofran in triage, IV fluids, ativan upon arrival to exam room as pt was hyperventilating, pt did calm down after medication given however still c/o nausea.  Phenergan given, pt able to keep  down several ounces of fluids.  All labs/imaging/findings discussed with patient. All questions answered and concerns addressed. Will discharge pt home and have pt f[facility]u with [facility] info provided. Return precautions given. Pt verbalized understanding and agreement with tx plan. Vitals: unremarkable. Discharged in stable condition.    Discussed pt with attending during ED encounter and agrees with plan.   Junius Finner, PA-C 11/26/13 [phone]

## 2013-11-25 NOTE — ED Notes (Signed)
Pt c/o nausea.  

## 2013-11-25 NOTE — ED Notes (Signed)
Pt reports having episodes of emesis for the past 24 hours and being unable to tolerate PO fluid during that time. Pt denies abdominal pain, however states "I have vomited so much that my chest hurts." Pt denies sick contacts. Pt reports she is currently menstruating. Pt is A/O x4, in NAD, and vitals are WDL.

## 2013-11-25 NOTE — Discharge Instructions (Signed)
Dysmenorrhea Dysmenorrhea is pain during a menstrual period. You will have pain in the lower belly (abdomen). The pain is caused by the tightening (contracting) of the muscles of the uterus. The pain can be minor or severe. Headache, feeling sick to your stomach (nausea), throwing up (vomiting), or low back pain may occur with this condition. HOME CARE  Only take medicine as told by your doctor.  Place a heating pad or hot water bottle on your lower back or belly. Do not sleep with a heating pad.  Exercise may help lessen the pain.  Massage the lower back or belly.  Stop smoking.  Avoid alcohol and caffeine. GET HELP IF:   Your pain does not get better with medicine.  You have pain during sex.  Your pain gets worse while taking pain medicine.  Your period bleeding is heavier than normal.  You keep feeling sick to your stomach or keep throwing up. GET HELP RIGHT AWAY IF: You pass out (faint). Document Released: 12/30/2008 Document Revised: 06/05/2013 Document Reviewed: 03/21/2013 [facility] Patient Information 2014 [place], [place].  Cyclic Vomiting Syndrome Cyclic vomiting syndrome is a benign condition in which patients experience bouts or cycles of severe nausea and vomiting that last for hours or even days. The bouts of nausea and vomiting alternate with longer periods of no symptoms and generally good health. Cyclic vomiting syndrome occurs mostly in children, but can affect adults. CAUSES  [facility] has no known cause. Each episode is typically similar to the previous ones. The episodes tend to:   Start at about the same time of day.  Last the same length of time.  Present the same symptoms at the same level of intensity. Cyclic vomiting syndrome can begin at any age in children and adults. Cyclic vomiting syndrome usually starts between the ages of 3 and 7 years. In adults, episodes tend to occur less often than they do in children, but they last longer. Furthermore, the events  or situations that trigger episodes in adults cannot always be pinpointed as easily as they can in children. There are 4 phases of cyclic vomiting syndrome: 1. Prodrome. The prodrome phase signals that an episode of nausea and vomiting is about to begin. This phase can last from just a few minutes to several hours. This phase is often marked by belly (abdominal) pain. Sometimes taking medicine early in the prodrome phase can stop an episode in progress. However, sometimes there is no warning. A person may simply wake up in the middle of the night or early morning and begin vomiting. 2. Episode. The episode phase consists of:  Severe vomiting.  Nausea.  Gagging (retching). 3. Recovery. The recovery phase begins when the nausea and vomiting stop. Healthy color, appetite, and energy return. 4. Symptom-free interval. The symptom-free interval phase is the period between episodes when no symptoms are present. TRIGGERS Episodes can be triggered by an infection or event. Examples of triggers include:  Infections.  Colds, allergies, sinus problems, and the flu.  Eating certain foods such as chocolate or cheese.  Foods with monosodium glutamate (MSG) or preservatives.  Fast foods.  Pre-packaged foods.  Foods with low nutritional value (junk foods).  Overeating.  Eating just before going to bed.  Hot weather.  Dehydration.  Not enough sleep or poor sleep quality.  Physical exhaustion.  Menstruation.  Motion sickness.  Emotional stress (school or home difficulties).  Excitement or stress. SYMPTOMS  The main symptoms of cyclic vomiting syndrome are:  Severe vomiting.  Nausea.  Gagging (retching). Episodes usually begin at night or the first thing in the morning. Episodes may include vomiting or retching up to 5 or 6 times an hour during the worst of the episode. Episodes usually last anywhere from 1 to 4 days. Episodes can last for up to 10 days. Other symptoms  include:  Paleness.  Exhaustion.  Listlessness.  Abdominal pain.  Loose stools or diarrhea. Sometimes the nausea and vomiting are so severe that a person appears to be almost unconscious. Sensitivity to light, headache, fever, dizziness, may also accompany an episode. In addition, the vomiting may cause drooling and excessive thirst. Drinking water usually leads to more vomiting, though the water can dilute the acid in the vomit, making the episode a little less painful. Continuous vomiting can lead to dehydration, which means that the body has lost excessive water and salts. DIAGNOSIS  Cyclic vomiting syndrome is hard to diagnose because there are no clear tests to identify it. A caregiver must diagnose cyclic vomiting syndrome by looking at symptoms and medical history. A caregiver must exclude more common diseases or disorders that can also cause nausea and vomiting. Also, diagnosis takes time because caregivers need to identify a pattern or cycle to the vomiting. TREATMENT  Cyclic vomiting syndrome cannot be cured. Treatment varies, but people with cyclic vomiting syndrome should get plenty of rest and sleep and take medications that prevent, stop, or lessen the vomiting episodes and other symptoms. People whose episodes are frequent and long-lasting may be treated during the symptom-free intervals in an effort to prevent or ease future episodes. The symptom-free phase is a good time to eliminate anything known to trigger an episode. For example, if episodes are brought on by stress or excitement, this period is the time to find ways to reduce stress and stay calm. If sinus problems or allergies cause episodes, those conditions should be treated. The triggers listed above should be avoided or prevented. Because of the similarities between migraine and cyclic vomiting syndrome, caregivers treat some people with severe cyclic vomiting syndrome with drugs that are also used for migraine headaches.  The drugs are designed to:  Prevent episodes.  Reduce their frequency.  Lessen their severity. HOME CARE INSTRUCTIONS Once a vomiting episode begins, treatment is supportive. It helps to stay in bed and sleep in a dark, quiet room. Severe nausea and vomiting may require hospitalization and intravenous (IV) fluids to prevent dehydration. Relaxing medications (sedatives) may help if the nausea continues. Sometimes, during the prodrome phase, it is possible to stop an episode from happening altogether. Only take over-the-counter or prescription medicines for pain, discomfort or fever as directed by your caregiver. Do not give aspirin to children. During the recovery phase, drinking water and replacing lost electrolytes (salts in the blood) are very important. Electrolytes are salts that the body needs to function well and stay healthy. Symptoms during the recovery phase can vary. Some people find that their appetites return to normal immediately, while others need to begin by drinking clear liquids and then move slowly to solid food. RELATED COMPLICATIONS The severe vomiting that defines cyclic vomiting syndrome is a risk factor for several complications:  Dehydration Vomiting causes the body to lose water quickly.  Electrolyte imbalance Vomiting also causes the body to lose the important salts it needs to keep working properly.  Peptic esophagitis The tube that connects the mouth to the stomach (esophagus) becomes injured from the stomach acid that comes up with the vomit.  Hematemesis The esophagus becomes  irritated and bleeds, so blood mixes with the vomit.  Mallory-Weiss tear The lower end of the esophagus may tear open or the stomach may bruise from vomiting or retching.  Tooth decay The acid in the vomit can hurt the teeth by corroding the tooth enamel. SEEK MEDICAL CARE IF: You have questions or problems. Document Released: 12/12/2001 Document Revised: 12/26/2011 Document Reviewed:  01/10/2011 [facility] Patient Information 2014 [place], [place].

## 2013-11-26 NOTE — ED Provider Notes (Signed)
Medical screening examination/treatment/procedure(s) were performed by non-physician practitioner and as supervising physician I was immediately available for consultation/collaboration.  EKG Interpretation   None         Junius ArgyleForrest S Abbigal Radich, MD 11/26/13 1339

## 2013-11-27 LAB — URINE CULTURE: Colony Count: >=100000

## 2014-07-15 ENCOUNTER — Encounter (HOSPITAL_COMMUNITY): Payer: Self-pay | Admitting: Emergency Medicine

## 2014-07-15 ENCOUNTER — Emergency Department (INDEPENDENT_AMBULATORY_CARE_PROVIDER_SITE_OTHER)
Admission: EM | Admit: 2014-07-15 | Discharge: 2014-07-15 | Disposition: A | Discharge status: Home / Self Care | Payer: Self-pay | Source: Home / Self Care | Attending: Family Medicine | Admitting: Family Medicine

## 2014-07-15 DIAGNOSIS — K047 Periapical abscess without sinus: Secondary | ICD-10-CM

## 2014-07-15 MED ORDER — CLINDAMYCIN HCL 300 MG PO CAPS: 300.0000 mg | ORAL_CAPSULE | Freq: Three times a day (TID) | ORAL | Status: DC

## 2014-07-15 MED ORDER — DICLOFENAC POTASSIUM 50 MG PO TABS: 50.0000 mg | ORAL_TABLET | Freq: Three times a day (TID) | ORAL | Status: DC

## 2014-07-15 NOTE — ED Provider Notes (Signed)
CSN: [id number]     Arrival date & time 07/15/14  1316 History   First MD Initiated Contact with Patient 07/15/14 1334     Chief Complaint  Patient presents with  . Dental Pain   (Consider location/radiation/quality/duration/timing/severity/associated sxs/prior Treatment) Patient is a 23 y.o. female presenting with tooth pain. The history is provided by the patient.  Dental Pain Location:  Lower Lower teeth location:  18/LL 2nd molar Quality:  Throbbing Severity:  Moderate Onset quality:  Gradual Duration:  2 weeks Progression:  Worsening Chronicity:  New Context: abscess and dental caries   Associated symptoms: facial pain and facial swelling   Associated symptoms: no difficulty swallowing and no fever   Risk factors: lack of dental care     Past Medical History  Diagnosis Date  . Medical history non-contributory    Past Surgical History  Procedure Laterality Date  . No past surgeries     Family History  Problem Relation Age of Onset  . Hypertension Mother    History  Substance Use Topics  . Smoking status: Never Smoker   . Smokeless tobacco: Never Used  . Alcohol Use: No   OB History   Grav Para Term Preterm Abortions TAB SAB Ect Mult Living   0              Review of Systems  Constitutional: Negative.  Negative for fever.  HENT: Positive for dental problem, ear pain and facial swelling.     Allergies  Vancomycin  Home Medications   Prior to Admission medications   Medication Sig Start Date End Date Taking? Authorizing Provider  clindamycin (CLEOCIN) 300 MG capsule Take 1 capsule (300 mg total) by mouth 3 (three) times daily. 07/15/14   Linna HoffJames D Jahmez Bily, MD  diclofenac (CATAFLAM) 50 MG tablet Take 1 tablet (50 mg total) by mouth 3 (three) times daily. For dental pain. 07/15/14   Linna HoffJames D Kelcie Currie, MD  ondansetron (ZOFRAN ODT) 4 MG disintegrating tablet Take 1 tablet (4 mg total) by mouth every 8 (eight) hours as needed for nausea. 11/25/13   Junius FinnerErin O'Malley, PA-C    BP 121/72  Pulse 73  Temp(Src) 98.6 F (37 C) (Oral)  SpO2 100%  LMP 06/16/2014 Physical Exam  Nursing note and vitals reviewed. Constitutional: She appears well-developed and well-nourished.  HENT:  Right Ear: External ear normal.  Left Ear: External ear normal.  Mouth/Throat: Uvula is midline and mucous membranes are normal. Abnormal dentition. Dental abscesses and dental caries present. No tonsillar abscesses.    ED Course  Procedures (including critical care time) Labs Review Labs Reviewed - No data to display  Imaging Review No results found.   MDM   1. Abscess, dental        Linna HoffJames D Haydee Jabbour, MD 07/15/14 1400

## 2014-07-15 NOTE — Discharge Instructions (Signed)
Take medicine as prescribed, see your dentist as soon as possible °

## 2014-07-15 NOTE — ED Notes (Signed)
Pt has had pain on the left lower side of her mouth for about 2 weeks, pt said that the pain is in one of the lower teeth on the left side of her mouth

## 2015-03-14 ENCOUNTER — Emergency Department (HOSPITAL_COMMUNITY)
Admission: EM | Admit: 2015-03-14 | Discharge: 2015-03-14 | Disposition: A | Discharge status: Home / Self Care | Payer: No Typology Code available for payment source | Attending: Emergency Medicine | Admitting: Emergency Medicine

## 2015-03-14 ENCOUNTER — Encounter (HOSPITAL_COMMUNITY): Payer: Self-pay | Admitting: *Deleted

## 2015-03-14 DIAGNOSIS — R112 Nausea with vomiting, unspecified: Secondary | ICD-10-CM | POA: Diagnosis present

## 2015-03-14 DIAGNOSIS — Z792 Long term (current) use of antibiotics: Secondary | ICD-10-CM | POA: Diagnosis not present

## 2015-03-14 HISTORY — DX: Pneumothorax, unspecified: J93.9

## 2015-03-14 LAB — CBC WITH DIFFERENTIAL/PLATELET
BASOS PCT: 0 % (ref 0–1)
Basophils Absolute: 0 10*3/uL (ref 0.0–0.1)
EOS ABS: 0 10*3/uL (ref 0.0–0.7)
EOS PCT: 0 % (ref 0–5)
HCT: 43.4 % (ref 36.0–46.0)
HEMOGLOBIN: 15.3 g/dL — AB (ref 12.0–15.0)
Lymphocytes Relative: 18 % (ref 12–46)
Lymphs Abs: 1.6 10*3/uL (ref 0.7–4.0)
MCH: 32.5 pg (ref 26.0–34.0)
MCHC: 35.3 g/dL (ref 30.0–36.0)
MCV: 92.1 fL (ref 78.0–100.0)
MONOS PCT: 6 % (ref 3–12)
Monocytes Absolute: 0.6 10*3/uL (ref 0.1–1.0)
NEUTROS ABS: 6.6 10*3/uL (ref 1.7–7.7)
Neutrophils Relative %: 76 % (ref 43–77)
Platelets: 310 10*3/uL (ref 150–400)
RBC: 4.71 MIL/uL (ref 3.87–5.11)
RDW: 12.4 % (ref 11.5–15.5)
WBC: 8.8 10*3/uL (ref 4.0–10.5)

## 2015-03-14 LAB — I-STAT TROPONIN, ED: Troponin i, poc: 0 ng/mL (ref 0.00–0.08)

## 2015-03-14 LAB — COMPREHENSIVE METABOLIC PANEL
ALT: 45 U/L (ref 14–54)
AST: 55 U/L — ABNORMAL HIGH (ref 15–41)
Albumin: 5 g/dL (ref 3.5–5.0)
Alkaline Phosphatase: 54 U/L (ref 38–126)
Anion gap: 14 (ref 5–15)
BILIRUBIN TOTAL: 2.5 mg/dL — AB (ref 0.3–1.2)
BUN: 24 mg/dL — AB (ref 6–20)
CO2: 23 mmol/L (ref 22–32)
Calcium: 9.9 mg/dL (ref 8.9–10.3)
Chloride: 101 mmol/L (ref 101–111)
Creatinine, Ser: 0.88 mg/dL (ref 0.44–1.00)
Glucose, Bld: 93 mg/dL (ref 65–99)
Potassium: 5.2 mmol/L — ABNORMAL HIGH (ref 3.5–5.1)
Sodium: 138 mmol/L (ref 135–145)
TOTAL PROTEIN: 8 g/dL (ref 6.5–8.1)

## 2015-03-14 LAB — I-STAT BETA HCG BLOOD, ED (MC, WL, AP ONLY)

## 2015-03-14 MED ORDER — ONDANSETRON 4 MG PO TBDP: 4.0000 mg | ORAL_TABLET | Freq: Three times a day (TID) | ORAL | Status: DC | PRN

## 2015-03-14 MED ORDER — ONDANSETRON HCL 4 MG/2ML IJ SOLN
4.0000 mg | Freq: Once | INTRAMUSCULAR | Status: AC
  Administered 2015-03-14: 4 mg via INTRAVENOUS
  Filled 2015-03-14: qty 2

## 2015-03-14 MED ORDER — SODIUM CHLORIDE 0.9 % IV BOLUS (SEPSIS)
1000.0000 mL | Freq: Once | INTRAVENOUS | Status: AC
  Administered 2015-03-14: 1000 mL via INTRAVENOUS

## 2015-03-14 NOTE — ED Provider Notes (Signed)
CSN: [id number]     Arrival date & time 03/14/15  1140 History   First MD Initiated Contact with Patient 03/14/15 1201     Chief Complaint  Patient presents with  . Emesis     (Consider location/radiation/quality/duration/timing/severity/associated sxs/prior Treatment) Patient is a 24 y.o. female presenting with vomiting. The history is provided by the patient and medical records.  Emesis   This is a 24 year old female with no significant past medical history, presenting to the ED for emesis for the past 3 days. Patient states she usually gets nausea and vomiting with onset of her menstrual, however symptoms this time are less controllable. States menstrual flow is normal, no pelvic pain, vaginal discharge, or urinary symptoms. She states she has generalized abdominal cramping from vomiting. She has lost count of how many episodes of emesis. She denies any hematemesis. No diarrhea.  She states she has burning in her upper chest and throat due to vomiting.  She denies SOB. No cough, nasal congestion, fever, chills, sweats.  VSS.  Past Medical History  Diagnosis Date  . Medical history non-contributory   . Pneumothorax    Past Surgical History  Procedure Laterality Date  . No past surgeries     Family History  Problem Relation Age of Onset  . Hypertension Mother    History  Substance Use Topics  . Smoking status: Never Smoker   . Smokeless tobacco: Never Used  . Alcohol Use: No   OB History    Gravida Para Term Preterm AB TAB SAB Ectopic Multiple Living   0              Review of Systems  Gastrointestinal: Positive for vomiting.  All other systems reviewed and are negative.     Allergies  Vancomycin  Home Medications   Prior to Admission medications   Medication Sig Start Date End Date Taking? Authorizing Provider  clindamycin (CLEOCIN) 300 MG capsule Take 1 capsule (300 mg total) by mouth 3 (three) times daily. 07/15/14   Linna Hoff, MD  diclofenac (CATAFLAM) 50  MG tablet Take 1 tablet (50 mg total) by mouth 3 (three) times daily. For dental pain. 07/15/14   Linna Hoff, MD  ondansetron (ZOFRAN ODT) 4 MG disintegrating tablet Take 1 tablet (4 mg total) by mouth every 8 (eight) hours as needed for nausea. 11/25/13   Junius Finner, PA-C   BP 138/85 mmHg  Pulse 79  Temp(Src) 98.6 F (37 C) (Oral)  Resp 16  Ht 5' (1.524 m)  Wt 95 lb (43.092 kg)  BMI 18.55 kg/m2  SpO2 99%  LMP 03/11/2015   Physical Exam  Constitutional: She is oriented to person, place, and time. She appears well-developed and well-nourished. No distress.  HENT:  Head: Normocephalic and atraumatic.  Mouth/Throat: Oropharynx is clear and moist.  Mildly dry mucous membranes  Eyes: Conjunctivae and EOM are normal. Pupils are equal, round, and reactive to light.  Neck: Normal range of motion.  Cardiovascular: Normal rate, regular rhythm and normal heart sounds.   Pulmonary/Chest: Effort normal and breath sounds normal. No respiratory distress. She has no wheezes.  Abdominal: Soft. Bowel sounds are normal. There is no tenderness. There is no guarding.  Musculoskeletal: Normal range of motion.  Neurological: She is alert and oriented to person, place, and time.  Skin: Skin is warm and dry. She is not diaphoretic.  Psychiatric: She has a normal mood and affect.  Nursing note and vitals reviewed.   ED Course  Procedures (  including critical care time) Labs Review Labs Reviewed  CBC WITH DIFFERENTIAL/PLATELET - Abnormal; Notable for the following:    Hemoglobin 15.3 (*)    All other components within normal limits  COMPREHENSIVE METABOLIC PANEL - Abnormal; Notable for the following:    Potassium 5.2 (*)    BUN 24 (*)    AST 55 (*)    Total Bilirubin 2.5 (*)    All other components within normal limits  I-STAT TROPOININ, ED  I-STAT BETA HCG BLOOD, ED ([facility], [facility], [facility] ONLY)    Imaging Review No results found.   EKG Interpretation   Date/Time:  Saturday Mar 14 2015 11:55:10  EDT Ventricular Rate:  49 PR Interval:  104 QRS Duration: 80 QT Interval:  576 QTC Calculation: 520 R Axis:   83 Text Interpretation:  Sinus bradycardia with short PR T wave abnormality,  consider anterior ischemia Prolonged QT Otherwise no significant change  Confirmed by HARRISON  MD, FORREST ([id number]) on 03/14/2015 12:09:14 PM      MDM   Final diagnoses:  Nausea and vomiting, vomiting of unspecified type   24 year old female here with nausea and vomiting after starting her menstrual period she reports history of same.  Patient afebrile, nontoxic. Abdominal exam is benign. No abnormalities regarding menstrual flow or pelvic pain.  She did report chest/throat discomfort which is likely secondary to her vomiting. I doubt ACS, PE, dissection, or other acute cardiac event. Her mucous membranes are mildly dry but she does not appear significantly dehydrated.  Laboratory is reassuring-- K+ is minimally elevated 0.1 above upper limit of normal, however no EKG changes.  Patient was given IV fluids and zofran with resolution of symptoms.  She has had no active vomiting here in the ED.  She has tolerated PO fluids without difficulty.  Will d/c home with symptomatic care-- encouraged fluids, script for zofran given.  Discussed plan with patient, he/she acknowledged understanding and agreed with plan of care.  Return precautions given for new or worsening symptoms.  Of note, patient's BP somewhat soft at time of discharge, however reports history of the same.  She is asymptomatic of this-- no dizziness, lightheadedness, or feelings of syncope.  Remainder of VS are stable.  [name], PA-C 03/14/15 1508  Purvis SheffieldForrest Harrison, MD 03/15/15 Serena [id number]

## 2015-03-14 NOTE — Discharge Instructions (Signed)
Take the prescribed medication as directed for nausea.  Make sure that you are drinking fluids to stay hydrated. Follow-up with [facility]. Return to the ED for new or worsening symptoms.

## 2015-03-14 NOTE — ED Notes (Signed)
Pt c/o emesis x 3 days since she started her menses.  States this normally happens except this time it is worse.  Pt now also c/o chest pain, she feels it's r/t vomiting.

## 2015-03-14 NOTE — ED Notes (Signed)
Patient is alert and orientedx4.  Patient was explained discharge instructions and they understood them with no questions.  The patient's sister, Anselm LisShaunte Woolworth is coming to take the patient home.

## 2015-06-23 ENCOUNTER — Emergency Department (HOSPITAL_COMMUNITY)
Admission: EM | Admit: 2015-06-23 | Discharge: 2015-06-24 | Disposition: A | Discharge status: Home / Self Care | Payer: No Typology Code available for payment source | Attending: Emergency Medicine | Admitting: Emergency Medicine

## 2015-06-23 ENCOUNTER — Encounter (HOSPITAL_COMMUNITY): Payer: Self-pay | Admitting: Emergency Medicine

## 2015-06-23 DIAGNOSIS — R1084 Generalized abdominal pain: Secondary | ICD-10-CM | POA: Diagnosis not present

## 2015-06-23 DIAGNOSIS — Z8709 Personal history of other diseases of the respiratory system: Secondary | ICD-10-CM | POA: Insufficient documentation

## 2015-06-23 DIAGNOSIS — R197 Diarrhea, unspecified: Secondary | ICD-10-CM | POA: Insufficient documentation

## 2015-06-23 DIAGNOSIS — R112 Nausea with vomiting, unspecified: Secondary | ICD-10-CM | POA: Insufficient documentation

## 2015-06-23 DIAGNOSIS — Z3202 Encounter for pregnancy test, result negative: Secondary | ICD-10-CM | POA: Insufficient documentation

## 2015-06-23 DIAGNOSIS — R531 Weakness: Secondary | ICD-10-CM | POA: Insufficient documentation

## 2015-06-23 DIAGNOSIS — R5383 Other fatigue: Secondary | ICD-10-CM | POA: Diagnosis not present

## 2015-06-23 LAB — CBC
HCT: 38.8 % (ref 36.0–46.0)
Hemoglobin: 13.2 g/dL (ref 12.0–15.0)
MCH: 31.9 pg (ref 26.0–34.0)
MCHC: 34 g/dL (ref 30.0–36.0)
MCV: 93.7 fL (ref 78.0–100.0)
PLATELETS: 270 10*3/uL (ref 150–400)
RBC: 4.14 MIL/uL (ref 3.87–5.11)
RDW: 13 % (ref 11.5–15.5)
WBC: 10.7 10*3/uL — ABNORMAL HIGH (ref 4.0–10.5)

## 2015-06-23 LAB — I-STAT BETA HCG BLOOD, ED (MC, WL, AP ONLY)

## 2015-06-23 NOTE — ED Notes (Signed)
Pt c/o abdominal pain and emesis x2 days. Pt will not raise her head to speak about what is going on with her. Denies sore throat/cough/diarrhea. No other c/c.

## 2015-06-23 NOTE — ED Notes (Signed)
Pt aware of need for urine sample.  

## 2015-06-23 NOTE — ED Provider Notes (Signed)
CSN: [id number]     Arrival date & time 06/23/15  2259 History   First MD Initiated Contact with Patient 06/23/15 2354     Chief Complaint  Patient presents with  . Emesis  . Abdominal Pain    HPI   [name] is a 24 y.o. female with no pertinent PMH of who presents to the ED with nausea, vomiting, and diffuse abdominal pain x 2 days. Reports she has episodes of emesis with her menstrual cycle every other month, however her symptoms are more severe this time. She is currently on her menstrual cycle. She states she "stopped counting" how many times she has vomited. Denies hematemesis. States she has "not been able to keep anything down." Denies exacerbating or alleviating factors. Denies fever, chills, headache, lightheadedness, dizziness, chest pain, shortness of breath. Reports episode of diarrhea yesterday. Denies blood in stool. Reports generalized weakness and fatigue since the onset of her symptoms.   Past Medical History  Diagnosis Date  . Medical history non-contributory   . Pneumothorax    Past Surgical History  Procedure Laterality Date  . No past surgeries     Family History  Problem Relation Age of Onset  . Hypertension Mother    Social History  Substance Use Topics  . Smoking status: Never Smoker   . Smokeless tobacco: Never Used  . Alcohol Use: No   OB History    Gravida Para Term Preterm AB TAB SAB Ectopic Multiple Living   0               Review of Systems  Constitutional: Positive for fatigue. Negative for fever, chills, activity change and appetite change.  Respiratory: Negative for shortness of breath.   Cardiovascular: Negative for chest pain, palpitations and leg swelling.  Gastrointestinal: Positive for nausea, vomiting, abdominal pain and diarrhea. Negative for constipation, blood in stool and abdominal distention.  Genitourinary: Negative for dysuria, urgency and frequency.  Musculoskeletal: Negative for myalgias, back pain, arthralgias, neck  pain and neck stiffness.  Skin: Negative for color change, pallor, rash and wound.  Neurological: Positive for weakness. Negative for dizziness, syncope, light-headedness, numbness and headaches.  All other systems reviewed and are negative.     Allergies  Vancomycin  Home Medications   Prior to Admission medications   Medication Sig Start Date End Date Taking? Authorizing Provider  naproxen sodium (ANAPROX) 220 MG tablet Take 660 mg by mouth 2 (two) times daily as needed (for pain.).   Yes Historical Provider, MD  ondansetron (ZOFRAN ODT) 4 MG disintegrating tablet Take 1 tablet (4 mg total) by mouth every 8 (eight) hours as needed for nausea. Patient not taking: Reported on 06/23/2015 03/14/15   Garlon Hatchet, PA-C    BP 129/78 mmHg  Pulse 53  Temp(Src) 98.4 F (36.9 C) (Oral)  Resp 18  SpO2 100%  LMP 06/23/2015 (Exact Date) Physical Exam  Constitutional: She is oriented to person, place, and time. She appears well-developed and well-nourished. No distress.  HENT:  Head: Normocephalic and atraumatic.  Right Ear: External ear normal.  Left Ear: External ear normal.  Nose: Nose normal.  Mouth/Throat: Uvula is midline. Mucous membranes are dry.  Eyes: Conjunctivae, EOM and lids are normal. Pupils are equal, round, and reactive to light. Right eye exhibits no discharge. Left eye exhibits no discharge. No scleral icterus.  Neck: Normal range of motion. Neck supple.  Cardiovascular: Normal rate, regular rhythm, normal heart sounds, intact distal pulses and normal pulses.  Pulmonary/Chest: Effort normal and breath sounds normal. No respiratory distress.  Abdominal: Soft. Normal appearance and bowel sounds are normal. She exhibits no distension and no mass. There is generalized tenderness. There is no rigidity, no rebound and no guarding.  Mild diffuse TTP of abdomen. No rebound, guarding, or masses.   Musculoskeletal: Normal range of motion. She exhibits no edema or tenderness.   Neurological: She is alert and oriented to person, place, and time.  Skin: Skin is warm, dry and intact. No rash noted. She is not diaphoretic. No erythema. No pallor.  Psychiatric: She has a normal mood and affect. Her speech is normal and behavior is normal. Judgment and thought content normal.  Nursing note and vitals reviewed.   ED Course  Procedures (including critical care time)  Labs Review Labs Reviewed  LIPASE, BLOOD - Abnormal; Notable for the following:    Lipase 16 (*)    All other components within normal limits  COMPREHENSIVE METABOLIC PANEL - Abnormal; Notable for the following:    Potassium 3.2 (*)    Glucose, Bld 117 (*)    BUN 25 (*)    Total Protein 8.3 (*)    All other components within normal limits  CBC - Abnormal; Notable for the following:    WBC 10.7 (*)    All other components within normal limits  URINALYSIS, ROUTINE W REFLEX MICROSCOPIC (NOT AT [facility]) - Abnormal; Notable for the following:    Color, Urine RED (*)    APPearance CLOUDY (*)    Hgb urine dipstick LARGE (*)    Protein, ur 30 (*)    Leukocytes, UA TRACE (*)    All other components within normal limits  URINE MICROSCOPIC-ADD ON - Abnormal; Notable for the following:    Squamous Epithelial / LPF FEW (*)    All other components within normal limits  I-STAT BETA HCG BLOOD, ED ([facility], [facility], [facility] ONLY)    Imaging Review No results found.   I have personally reviewed and evaluated these lab results as part of my medical decision-making.   EKG Interpretation None      MDM   Final diagnoses:  Non-intractable vomiting with nausea, vomiting of unspecified type    24 year old female presents with nausea, vomiting, and diffuse abdominal pain x 2 days. Denies hematemesis. Denies fever, chills, headache, lightheadedness, dizziness, chest pain, shortness of breath. Reports episode of diarrhea yesterday. Denies blood in stool. Reports generalized weakness and fatigue.  Patient is afebrile. Mucous  membranes appear dry. Mild diffuse TTP of abdomen with no rebound, guarding, or masses.  CBC with mild leukocytosis of 10.7, CMP with potassium 3.2, repleted in the ED. Lipase unremarkable. Beta hCG negative. UA with large hemoglobin and TNTC RBC, consistent with patient currently on menstrual cycle.  Given zofran for control of N/V and 1L bolus NS. Patient reports significant improvement in symptoms s/p zofran and fluids. Abdominal exam benign.  Patient able to tolerate PO intake in the ED. Will discharge with zofran.  Follow-up with PCP. Return precautions discussed.  Notified patient's BP is somewhat soft at time of discharge. On chart review, appears to have history of the same. She is asymptomatic, and denies dizziness, lightheadedness, or feeling like she might pass out. Remainder of vitals stable.   BP 129/78 mmHg  Pulse 53  Temp(Src) 98.4 F (36.9 C) (Oral)  Resp 18  SpO2 100%  LMP 06/23/2015 (Exact Date)       Mady Gemma, PA-C 06/24/15 0201  Kendra Manis  Armstead Peaks, PA-C 06/24/15 0217  Loren Racer, MD 06/24/15 [phone]

## 2015-06-24 LAB — COMPREHENSIVE METABOLIC PANEL
ALBUMIN: 4.9 g/dL (ref 3.5–5.0)
ALK PHOS: 46 U/L (ref 38–126)
ALT: 21 U/L (ref 14–54)
AST: 32 U/L (ref 15–41)
Anion gap: 10 (ref 5–15)
BUN: 25 mg/dL — ABNORMAL HIGH (ref 6–20)
CALCIUM: 9.8 mg/dL (ref 8.9–10.3)
CO2: 25 mmol/L (ref 22–32)
CREATININE: 0.82 mg/dL (ref 0.44–1.00)
Chloride: 106 mmol/L (ref 101–111)
GFR calc non Af Amer: >60 mL/min (ref >60)
GLUCOSE: 117 mg/dL — AB (ref 65–99)
Potassium: 3.2 mmol/L — ABNORMAL LOW (ref 3.5–5.1)
SODIUM: 141 mmol/L (ref 135–145)
Total Bilirubin: 1.2 mg/dL (ref 0.3–1.2)
Total Protein: 8.3 g/dL — ABNORMAL HIGH (ref 6.5–8.1)

## 2015-06-24 LAB — URINALYSIS, ROUTINE W REFLEX MICROSCOPIC
BILIRUBIN URINE: NEGATIVE
Glucose, UA: NEGATIVE mg/dL
Ketones, ur: NEGATIVE mg/dL
Nitrite: NEGATIVE
PH: 6.5 (ref 5.0–8.0)
Protein, ur: 30 mg/dL — AB
SPECIFIC GRAVITY, URINE: 1.03 (ref 1.005–1.030)
Urobilinogen, UA: 0.2 mg/dL (ref 0.0–1.0)

## 2015-06-24 LAB — LIPASE, BLOOD: Lipase: 16 U/L — ABNORMAL LOW (ref 22–51)

## 2015-06-24 LAB — URINE MICROSCOPIC-ADD ON

## 2015-06-24 MED ORDER — POTASSIUM CHLORIDE CRYS ER 20 MEQ PO TBCR
40.0000 meq | EXTENDED_RELEASE_TABLET | Freq: Once | ORAL | Status: AC
  Administered 2015-06-24: 40 meq via ORAL
  Filled 2015-06-24: qty 2

## 2015-06-24 MED ORDER — ONDANSETRON 4 MG PO TBDP: 4.0000 mg | ORAL_TABLET | Freq: Three times a day (TID) | ORAL | Status: DC | PRN

## 2015-06-24 MED ORDER — SODIUM CHLORIDE 0.9 % IV BOLUS (SEPSIS)
1000.0000 mL | Freq: Once | INTRAVENOUS | Status: AC
  Administered 2015-06-24: 1000 mL via INTRAVENOUS

## 2015-06-24 MED ORDER — ONDANSETRON HCL 4 MG/2ML IJ SOLN
4.0000 mg | Freq: Once | INTRAMUSCULAR | Status: AC
  Administered 2015-06-24: 4 mg via INTRAVENOUS
  Filled 2015-06-24: qty 2

## 2015-06-24 NOTE — ED Notes (Signed)
Pt able to tolerate PO fluids without emesis.  

## 2015-06-24 NOTE — ED Notes (Signed)
Pt asymptomatic despite low BP.  Pt denies any dizziness or lightheadedness.

## 2015-06-24 NOTE — Discharge Instructions (Signed)
1. Medications: zofran, usual home medications 2. Treatment: rest, drink plenty of fluids 3. Follow Up: please followup with your primary doctor for discussion of your diagnoses and further evaluation after today's visit; if you do not have a primary care doctor use the resource guide provided to find one; please return to the ER for intractable vomiting, blood in vomit, severe abdominal pain, fever, new or worsening symptoms   Nausea and Vomiting Nausea is a sick feeling that often comes before throwing up (vomiting). Vomiting is a reflex where stomach contents come out of your mouth. Vomiting can cause severe loss of body fluids (dehydration). Children and elderly adults can become dehydrated quickly, especially if they also have diarrhea. Nausea and vomiting are symptoms of a condition or disease. It is important to find the cause of your symptoms. CAUSES   Direct irritation of the stomach lining. This irritation can result from increased acid production (gastroesophageal reflux disease), infection, food poisoning, taking certain medicines (such as nonsteroidal anti-inflammatory drugs), alcohol use, or tobacco use.  Signals from the brain.These signals could be caused by a headache, heat exposure, an inner ear disturbance, increased pressure in the brain from injury, infection, a tumor, or a concussion, pain, emotional stimulus, or metabolic problems.  An obstruction in the gastrointestinal tract (bowel obstruction).  Illnesses such as diabetes, hepatitis, gallbladder problems, appendicitis, kidney problems, cancer, sepsis, atypical symptoms of a heart attack, or eating disorders.  Medical treatments such as chemotherapy and radiation.  Receiving medicine that makes you sleep (general anesthetic) during surgery. DIAGNOSIS Your caregiver may ask for tests to be done if the problems do not improve after a few days. Tests may also be done if symptoms are severe or if the reason for the nausea  and vomiting is not clear. Tests may include:  Urine tests.  Blood tests.  Stool tests.  Cultures (to look for evidence of infection).  X-rays or other imaging studies. Test results can help your caregiver make decisions about treatment or the need for additional tests. TREATMENT You need to stay well hydrated. Drink frequently but in small amounts.You may wish to drink water, sports drinks, clear broth, or eat frozen ice pops or gelatin dessert to help stay hydrated.When you eat, eating slowly may help prevent nausea.There are also some antinausea medicines that may help prevent nausea. HOME CARE INSTRUCTIONS   Take all medicine as directed by your caregiver.  If you do not have an appetite, do not force yourself to eat. However, you must continue to drink fluids.  If you have an appetite, eat a normal diet unless your caregiver tells you differently.  Eat a variety of complex carbohydrates (rice, wheat, potatoes, bread), lean meats, yogurt, fruits, and vegetables.  Avoid high-fat foods because they are more difficult to digest.  Drink enough water and fluids to keep your urine clear or pale yellow.  If you are dehydrated, ask your caregiver for specific rehydration instructions. Signs of dehydration may include:  Severe thirst.  Dry lips and mouth.  Dizziness.  Dark urine.  Decreasing urine frequency and amount.  Confusion.  Rapid breathing or pulse. SEEK IMMEDIATE MEDICAL CARE IF:   You have blood or brown flecks (like coffee grounds) in your vomit.  You have black or bloody stools.  You have a severe headache or stiff neck.  You are confused.  You have severe abdominal pain.  You have chest pain or trouble breathing.  You do not urinate at least once every 8 hours.  You develop cold or clammy skin.  You continue to vomit for longer than 24 to 48 hours.  You have a fever. MAKE SURE YOU:   Understand these instructions.  Will watch your  condition.  Will get help right away if you are not doing well or get worse. Document Released: 10/03/2005 Document Revised: 12/26/2011 Document Reviewed: 03/02/2011 [facility] Patient Information 2015 [place], [place]. This information is not intended to replace advice given to you by your health care provider. Make sure you discuss any questions you have with your health care provider.   Emergency Department Resource Guide 1) Find a Doctor and Pay Out of Pocket Although you won't have to find out who is covered by your insurance plan, it is a good idea to ask around and get recommendations. You will then need to call the office and see if the doctor you have chosen will accept you as a new patient and what types of options they offer for patients who are self-pay. Some doctors offer discounts or will set up payment plans for their patients who do not have insurance, but you will need to ask so you aren't surprised when you get to your appointment.  2) Contact Your Local Health Department Not all health departments have doctors that can see patients for sick visits, but many do, so it is worth a call to see if yours does. If you don't know where your local health department is, you can check in your phone book. The CDC also has a tool to help you locate your state's health department, and many state websites also have listings of all of their local health departments.  3) Find a Walk-in Clinic If your illness is not likely to be very severe or complicated, you may want to try a walk in clinic. These are popping up all over the country in pharmacies, drugstores, and shopping centers. They're usually staffed by nurse practitioners or physician assistants that have been trained to treat common illnesses and complaints. They're usually fairly quick and inexpensive. However, if you have serious medical issues or chronic medical problems, these are probably not your best option.  No Primary Care  Doctor: - Call Health Connect at  [phone] - they can help you locate a primary care doctor that  accepts your insurance, provides certain services, etc. - Physician Referral Service- [phone]  Chronic Pain Problems: Organization         Address  Phone   Notes  [facility]  [phone] Patients need to be referred by their primary care doctor.   Medication Assistance: Organization         Address  Phone   Notes  [facility] Medication [facility] [address] [place] [place], [place] [address] [phone] --Must be a resident of [facility] -- Must have NO insurance coverage whatsoever (no Medicaid/ Medicare, etc.) -- The pt. MUST have a primary care doctor that directs their care regularly and follows them in the community   [facility]  [phone]   [employer]  [phone]    Agencies that provide inexpensive medical care: Organization         Address  Phone   Notes  [facility]  [phone]   [facility]    [phone]   [facility] [address] [place], [place] [address] [phone]   [facility] [address] [place] [place] [phone])  [phone]   Planned Parenthood    [phone]   [facility]    [phone]   [facility]  [address], [place] Phone:  [phone], Fax:  [phone] Hours of Operation:  9 am - 6 pm, M-F.  Also accepts Medicaid/Medicare and self-pay.  [facility]  [address], [place] Phone: [phone], Fax: [phone]. Hours of Operation:  8:30 am - 5:30 pm, M-F.  Also accepts Medicaid and self-pay.  [facility] [place] [address], [place] Phone: [phone]   [facility] [address] Natasha Bence [place], [place] [phone], Ext. [phone] Mondays & Thursdays: 7-9 AM.  First 15 patients are seen on a first  come, first serve basis.    Medicaid-accepting [facility] Providers:  Organization         Address  Phone   Notes  [facility] [address], [place] [phone] Also accepts self-pay patients.  [facility] [address] Laurell Josephs [place], [place]  [phone]   [facility] [address], [place] [phone]   [facility] [address] [place] [phone]   Renaye Rakers [address], [place]   [phone] Only accepts [place] Access [place] patients after they have their name applied to their card.   Self-Pay (no insurance) in [facility]:  Organization         Address  Phone   Notes  Sickle Cell Patients, [facility] Medicine [address] [place], [place] [phone]   [facility] [address] [place], [place] [phone]   [facility] [place]  [address], [place] [phone]   [facility]/Dr. Osei-Bonsu  [address], [place] or [address], [place] [phone] Phone number for both [place] and [place] locations is the same.  [facility] [address] [place] [phone]   [facility] [address] [place] or [address] [phone]   [facility] [address] [place] [phone], phone; [phone], fax Sees patients 1st and 3rd Saturday of every month.  Must not qualify for public or private insurance (i.e. Medicaid, Medicare, [place] Health Choice, Veterans' Benefits)  Household income should be no more than 200% of the poverty level The clinic cannot treat you if you are pregnant or think you are pregnant  Sexually transmitted diseases are not treated at the clinic.    Dental Care: Organization          Address  Phone  Notes  [facility] Department of [facility] [address] [place], [place] [phone] Accepts children up to age 13 who are enrolled in [place] or [place] Health Choice; pregnant women with a Medicaid card; and children who have applied for Medicaid or [place] Health Choice, but were declined, whose parents can pay a reduced fee at time of service.  [facility] Department of [facility]  [address], [place] [phone] Accepts children up to age 52 who are enrolled in [place] or [place] Health Choice; pregnant women with a Medicaid card; and children who have applied for Medicaid or [place] Health Choice,  but were declined, whose parents can pay a reduced fee at time of service.  [facility]  [address] [place], [place] [phone] Patients are seen by appointment only. Walk-ins are not accepted. [facility] will see patients 48 years of age and older. Monday - Tuesday (8am-5pm) Most Wednesdays (8:30-5pm) $30 per visit, cash only  [facility]  [address], [facility] [phone] Patients are seen by appointment only. Walk-ins are not accepted. [facility] will see patients 69 years of age and older. One Wednesday Evening (Monthly: Volunteer Based).  $30 per visit, cash only  [employer]  [phone] for adults; Children under age 63, call Graduate Pediatric Dentistry at [phone]. Children aged 37-14, please call [phone] to request a pediatric application.  Dental services are provided in all areas of dental care including fillings, crowns and bridges, complete and partial dentures, implants, gum treatment, root canals, and extractions. Preventive care is also provided. Treatment is provided to both adults and children. Patients are selected via a lottery and there is often a waiting list.   [facility] [address] [place]  [phone] [email or web address]   [facility] [address] [place], [place] [phone], Ext. [phone] Second and Fourth Thursday of each month, opens at 6:30 AM; Clinic ends at 9 AM.  Patients are seen on a first-come first-served basis, and a limited number are seen during each clinic.   [facility]  [address] Ether Griffins [place], [place] [phone]   Eligibility Requirements You must have lived in [place], [place], or [place] counties for at least the last three months.   You cannot be eligible for state or federal sponsored [place], including [employer], [place], or [employer].   You generally cannot be eligible for healthcare insurance through your employer.    How to apply: Eligibility screenings are held every Tuesday and Wednesday afternoon from 1:00 pm until 4:00 pm. You do not need an appointment for the interview!  [facility] [address], [place], [place] [phone]   [facility] Health Department  [phone]   [facility] Health Department  [phone]   [facility] Health Department  [phone]    Behavioral Health Resources in the Community: Intensive Outpatient Programs Organization         Address  Phone  Notes  [facility] [address] [place], [place] [phone]   [facility] Outpatient [address] [place], [place] [phone]   [facility] [address] [place], [place]  [phone]   [facility] [address],  [place], [place] [phone] or [phone]   Substance Abuse Resources Organization         Address  Phone  Notes  [facility]  [phone]   [facility]  [phone]   [place]  [phone]   Floydene Flock  [phone]   Residential & Outpatient Substance Abuse Program  [phone]   Psychological  Services Organization         Address  Phone  Notes  [facility]  [phone]   [facility] Services  [phone]   [facility] Mental Health [address], [place] [phone] or [phone]    Mobile Crisis Teams Organization         Address  Phone  Notes  [facility], Mobile Crisis Care Unit  [phone]   [facility]  [address] [place], [place] [phone]   [facility] [address] [place] [phone]    Self-Help/Support Groups Organization         Address  Phone             Notes  [facility] [place] - variety of support groups  [phone]- [phone] Call for more information  Narcotics Anonymous (NA), [facility] [address], [employer] [place]  2 meetings at this location   Statistician         Address  Phone  Notes  [facility] [address],    [place]  [phone]   [facility]  [address], [place] [address], [place], [place] [phone]   [facility] [address] [place], [place] [phone] Admissions: 8am-3pm M-F  [facility] [address]    [place], [place] [phone]   [facility] [address] [place], [place], [place] [phone]   [facility] [address],  [place], [place] [phone]   [facility] - Intensive Outpatient [address]ure[address] Joseph[address], [place], [place] [phone]   [facility] ([facility] [address] [place]  [place], [place] [phone] or [phone]   [facility] ([facility]) [address], [place], [place] [phone] Accepts Medicaid  [facility] [address]  [place] [phone] Substance Abuse/Addiction Treatment   [facility] Organization         Address  Phone  Notes  [facility]  [phone]   Angie Fava, PhD [address] Ervin Knack [place], [place]   [phone] or [phone]   [facility]   [address] [place], [place] [phone]   [facility] [address], [place], [place] [phone] Insurance/Medicaid/sponsorship through [facility] and Families [address]                                    [place], [place] [phone] Therapy/tele-psych/case  [facility] [address]awanna [place], [place] [phone]    Dr. Lolly Mustache  [phone]   [facility]  [employer] [facility] Dept. 1) [address] [place] 2) [address], [place] 3)  [address], [place] [phone]  [phone]   [facility] Child Abuse Hotline 5057401167 or [phone] (After Hours)

## 2015-12-21 ENCOUNTER — Encounter (HOSPITAL_COMMUNITY): Payer: Self-pay | Admitting: Emergency Medicine

## 2015-12-21 ENCOUNTER — Emergency Department (HOSPITAL_COMMUNITY)
Admission: EM | Admit: 2015-12-21 | Discharge: 2015-12-21 | Disposition: A | Discharge status: Home / Self Care | Payer: No Typology Code available for payment source | Attending: Emergency Medicine | Admitting: Emergency Medicine

## 2015-12-21 DIAGNOSIS — Z3202 Encounter for pregnancy test, result negative: Secondary | ICD-10-CM | POA: Insufficient documentation

## 2015-12-21 DIAGNOSIS — A599 Trichomoniasis, unspecified: Secondary | ICD-10-CM | POA: Insufficient documentation

## 2015-12-21 DIAGNOSIS — Z8709 Personal history of other diseases of the respiratory system: Secondary | ICD-10-CM | POA: Insufficient documentation

## 2015-12-21 DIAGNOSIS — R1110 Vomiting, unspecified: Secondary | ICD-10-CM | POA: Insufficient documentation

## 2015-12-21 DIAGNOSIS — R197 Diarrhea, unspecified: Secondary | ICD-10-CM | POA: Insufficient documentation

## 2015-12-21 LAB — URINALYSIS, ROUTINE W REFLEX MICROSCOPIC
Glucose, UA: NEGATIVE mg/dL
Ketones, ur: 15 mg/dL — AB
Nitrite: NEGATIVE
PH: 6 (ref 5.0–8.0)
Protein, ur: 100 mg/dL — AB
SPECIFIC GRAVITY, URINE: 1.037 — AB (ref 1.005–1.030)

## 2015-12-21 LAB — URINE MICROSCOPIC-ADD ON

## 2015-12-21 LAB — CBC
HEMATOCRIT: 40.4 % (ref 36.0–46.0)
HEMOGLOBIN: 13.8 g/dL (ref 12.0–15.0)
MCH: 31.4 pg (ref 26.0–34.0)
MCHC: 34.2 g/dL (ref 30.0–36.0)
MCV: 92 fL (ref 78.0–100.0)
Platelets: 304 10*3/uL (ref 150–400)
RBC: 4.39 MIL/uL (ref 3.87–5.11)
RDW: 12.7 % (ref 11.5–15.5)
WBC: 16.6 10*3/uL — ABNORMAL HIGH (ref 4.0–10.5)

## 2015-12-21 LAB — COMPREHENSIVE METABOLIC PANEL
ALBUMIN: 5.4 g/dL — AB (ref 3.5–5.0)
ALT: 23 U/L (ref 14–54)
ANION GAP: 11 (ref 5–15)
AST: 31 U/L (ref 15–41)
Alkaline Phosphatase: 60 U/L (ref 38–126)
BUN: 22 mg/dL — ABNORMAL HIGH (ref 6–20)
CHLORIDE: 107 mmol/L (ref 101–111)
CO2: 23 mmol/L (ref 22–32)
Calcium: 10.2 mg/dL (ref 8.9–10.3)
Creatinine, Ser: 0.9 mg/dL (ref 0.44–1.00)
GFR calc non Af Amer: >60 mL/min (ref >60)
GLUCOSE: 145 mg/dL — AB (ref 65–99)
POTASSIUM: 4 mmol/L (ref 3.5–5.1)
SODIUM: 141 mmol/L (ref 135–145)
Total Bilirubin: 1.2 mg/dL (ref 0.3–1.2)
Total Protein: 9.1 g/dL — ABNORMAL HIGH (ref 6.5–8.1)

## 2015-12-21 LAB — LIPASE, BLOOD: LIPASE: 18 U/L (ref 11–51)

## 2015-12-21 LAB — PREGNANCY, URINE: PREG TEST UR: NEGATIVE

## 2015-12-21 MED ORDER — METRONIDAZOLE 500 MG PO TABS: 500.0000 mg | ORAL_TABLET | Freq: Two times a day (BID) | ORAL | Status: DC

## 2015-12-21 MED ORDER — ONDANSETRON 4 MG PO TBDP
4.0000 mg | ORAL_TABLET | Freq: Once | ORAL | Status: DC
  Filled 2015-12-21: qty 1

## 2015-12-21 MED ORDER — PROMETHAZINE HCL 25 MG PO TABS: 25.0000 mg | ORAL_TABLET | Freq: Four times a day (QID) | ORAL | Status: DC | PRN

## 2015-12-21 MED ORDER — ONDANSETRON 4 MG PO TBDP
4.0000 mg | ORAL_TABLET | Freq: Once | ORAL | Status: AC | PRN
  Administered 2015-12-21: 4 mg via ORAL
  Filled 2015-12-21: qty 1

## 2015-12-21 NOTE — ED Notes (Signed)
Pt denies n/v/d at this time. Family at bedside. Pt tolerated po fld challenge well.

## 2015-12-21 NOTE — ED Notes (Signed)
Pt reports ongoing emesis with start of menstrual cycle; denies pain just states she is weak.

## 2015-12-21 NOTE — ED Provider Notes (Signed)
CSN: [id number]     Arrival date & time 12/21/15  0849 History   First MD Initiated Contact with Patient 12/21/15 [phone]     Chief Complaint  Patient presents with  . Emesis     (Consider location/radiation/quality/duration/timing/severity/associated sxs/prior Treatment) HPI Comments: 25 year old female who presents with vomiting and diarrhea. Patient states that she has had greater than 10 episodes of vomiting since yesterday. She's also had 3 episodes of diarrhea. She reports some abdominal pain with the vomiting. She is currently on her menstrual period and states that she often gets vomiting with menstruation. No recent travel. No fevers. No sick contacts.  Patient is a 25 y.o. female presenting with vomiting. The history is provided by the patient.  Emesis   Past Medical History  Diagnosis Date  . Medical history non-contributory   . Pneumothorax    Past Surgical History  Procedure Laterality Date  . No past surgeries     Family History  Problem Relation Age of Onset  . Hypertension Mother    Social History  Substance Use Topics  . Smoking status: Never Smoker   . Smokeless tobacco: Never Used  . Alcohol Use: No   OB History    Gravida Para Term Preterm AB TAB SAB Ectopic Multiple Living   0              Review of Systems  Gastrointestinal: Positive for vomiting.    10 Systems reviewed and are negative for acute change except as noted in the HPI.   Allergies  Vancomycin  Home Medications   Prior to Admission medications   Medication Sig Start Date End Date Taking? Authorizing Provider  ibuprofen (ADVIL,MOTRIN) 200 MG tablet Take 600 mg by mouth every 6 (six) hours as needed for moderate pain.   Yes Historical Provider, MD  naproxen sodium (ANAPROX) 220 MG tablet Take 660 mg by mouth every 8 (eight) hours as needed (for pain.).    Yes Historical Provider, MD  metroNIDAZOLE (FLAGYL) 500 MG tablet Take 1 tablet (500 mg total) by mouth 2 (two) times daily. 12/21/15    Laurence Spates, MD  promethazine (PHENERGAN) 25 MG tablet Take 1 tablet (25 mg total) by mouth every 6 (six) hours as needed for nausea or vomiting. 12/21/15   Ambrose [place] Garion Wempe, MD   BP 99/63 mmHg  Pulse 55  Temp(Src) 98.1 F (36.7 C) (Oral)  Resp 16  SpO2 100%  LMP 12/20/2015 Physical Exam  Constitutional: She is oriented to person, place, and time. She appears well-developed and well-nourished. No distress.  HENT:  Head: Normocephalic and atraumatic.  Moist mucous membranes  Eyes: Conjunctivae are normal. Pupils are equal, round, and reactive to light.  Neck: Neck supple.  Cardiovascular: Normal rate, regular rhythm and normal heart sounds.   No murmur heard. Pulmonary/Chest: Effort normal and breath sounds normal.  Abdominal: Soft. Bowel sounds are normal. She exhibits no distension. There is no rebound and no guarding.  Mild LUQ TTP  Musculoskeletal: She exhibits no edema.  Neurological: She is alert and oriented to person, place, and time.  Normal gait, Fluent speech  Skin: Skin is warm and dry.  Psychiatric: She has a normal mood and affect. Judgment normal.  Nursing note and vitals reviewed.   ED Course  Procedures (including critical care time) Labs Review Labs Reviewed  COMPREHENSIVE METABOLIC PANEL - Abnormal; Notable for the following:    Glucose, Bld 145 (*)    BUN 22 (*)    Total Protein  9.1 (*)    Albumin 5.4 (*)    All other components within normal limits  CBC - Abnormal; Notable for the following:    WBC 16.6 (*)    All other components within normal limits  URINALYSIS, ROUTINE W REFLEX MICROSCOPIC (NOT AT [facility]) - Abnormal; Notable for the following:    Color, Urine AMBER (*)    APPearance CLOUDY (*)    Specific Gravity, Urine 1.037 (*)    Hgb urine dipstick LARGE (*)    Bilirubin Urine SMALL (*)    Ketones, ur 15 (*)    Protein, ur 100 (*)    Leukocytes, UA SMALL (*)    All other components within normal limits  URINE MICROSCOPIC-ADD ON  - Abnormal; Notable for the following:    Squamous Epithelial / LPF 6-30 (*)    Bacteria, UA MANY (*)    All other components within normal limits  LIPASE, BLOOD  PREGNANCY, URINE  I-STAT BETA HCG BLOOD, ED ([facility], [facility], [facility] ONLY)    Imaging Review No results found. I have personally reviewed and evaluated these lab results as part of my medical decision-making.  Medications  ondansetron (ZOFRAN-ODT) disintegrating tablet 4 mg (4 mg Oral Refused 12/21/15 1452)  ondansetron (ZOFRAN-ODT) disintegrating tablet 4 mg (4 mg Oral Given 12/21/15 0909)     MDM   Final diagnoses:  Vomiting and diarrhea  Trichomonal infection   Pt w/ 1 day of vomiting and diarrhea in setting of menstruation. She was well-appearing on exam. Initial vital signs notable for mild tachycardia at 110. LUQ tenderness noted, no lower abdominal pain. Obtained above lab work which was notable for WBC 16.6. Gave the patient Zofran after which she PO challenged. No further episodes of vomiting.  Given vomiting and diarrhea as well as leukocytosis, her symptoms are consistent with gastroenteritis. She has no lower abdominal tenderness to suggest acute life-threatening process. I have discussed supportive care instructions and provided with Phenergan to use at home. UA was positive for Trichomonas. Provided with Flagyl prescription and discussed importance of having partner tested and treated. Return precautions reviewed and patient discharged in satisfactory condition.    Laurence Spatesachel Morgan Orva Riles, MD 12/21/15 [phone]

## 2015-12-21 NOTE — ED Notes (Signed)
Awake. Verbally responsive. A/O x4. Resp even and unlabored. No audible adventitious breath sounds noted. ABC's intact.  

## 2015-12-21 NOTE — ED Notes (Signed)
Pt tolerated fld challenge well.

## 2015-12-21 NOTE — ED Notes (Signed)
Bed: WA07 Expected date:  Expected time:  Means of arrival:  Comments: 

## 2016-03-11 ENCOUNTER — Emergency Department (HOSPITAL_COMMUNITY)
Admission: EM | Admit: 2016-03-11 | Discharge: 2016-03-11 | Disposition: A | Discharge status: Home / Self Care | Payer: No Typology Code available for payment source | Attending: Emergency Medicine | Admitting: Emergency Medicine

## 2016-03-11 ENCOUNTER — Encounter (HOSPITAL_COMMUNITY): Payer: Self-pay | Admitting: Emergency Medicine

## 2016-03-11 DIAGNOSIS — Z79899 Other long term (current) drug therapy: Secondary | ICD-10-CM | POA: Insufficient documentation

## 2016-03-11 DIAGNOSIS — R112 Nausea with vomiting, unspecified: Secondary | ICD-10-CM | POA: Insufficient documentation

## 2016-03-11 LAB — COMPREHENSIVE METABOLIC PANEL
ALT: 38 U/L (ref 14–54)
AST: 34 U/L (ref 15–41)
Albumin: 4.7 g/dL (ref 3.5–5.0)
Alkaline Phosphatase: 46 U/L (ref 38–126)
Anion gap: 11 (ref 5–15)
BILIRUBIN TOTAL: 1.3 mg/dL — AB (ref 0.3–1.2)
BUN: 32 mg/dL — AB (ref 6–20)
CO2: 22 mmol/L (ref 22–32)
CREATININE: 0.86 mg/dL (ref 0.44–1.00)
Calcium: 8.8 mg/dL — ABNORMAL LOW (ref 8.9–10.3)
Chloride: 106 mmol/L (ref 101–111)
Glucose, Bld: 91 mg/dL (ref 65–99)
POTASSIUM: 3.4 mmol/L — AB (ref 3.5–5.1)
Sodium: 139 mmol/L (ref 135–145)
TOTAL PROTEIN: 7.7 g/dL (ref 6.5–8.1)

## 2016-03-11 LAB — CBC
HCT: 42.8 % (ref 36.0–46.0)
HEMOGLOBIN: 15.3 g/dL — AB (ref 12.0–15.0)
MCH: 31.7 pg (ref 26.0–34.0)
MCHC: 35.7 g/dL (ref 30.0–36.0)
MCV: 88.6 fL (ref 78.0–100.0)
Platelets: 334 10*3/uL (ref 150–400)
RBC: 4.83 MIL/uL (ref 3.87–5.11)
RDW: 12.7 % (ref 11.5–15.5)
WBC: 11.4 10*3/uL — ABNORMAL HIGH (ref 4.0–10.5)

## 2016-03-11 LAB — LIPASE, BLOOD: LIPASE: 15 U/L (ref 11–51)

## 2016-03-11 LAB — I-STAT BETA HCG BLOOD, ED (MC, WL, AP ONLY)

## 2016-03-11 MED ORDER — SODIUM CHLORIDE 0.9 % IV BOLUS (SEPSIS)
1000.0000 mL | Freq: Once | INTRAVENOUS | Status: AC
  Administered 2016-03-11: 1000 mL via INTRAVENOUS

## 2016-03-11 MED ORDER — ONDANSETRON HCL 4 MG PO TABS: 4.0000 mg | ORAL_TABLET | Freq: Four times a day (QID) | ORAL | Status: DC

## 2016-03-11 MED ORDER — METOCLOPRAMIDE HCL 5 MG/ML IJ SOLN
10.0000 mg | Freq: Once | INTRAMUSCULAR | Status: AC
  Administered 2016-03-11: 10 mg via INTRAVENOUS
  Filled 2016-03-11: qty 2

## 2016-03-11 MED ORDER — ONDANSETRON HCL 4 MG/2ML IJ SOLN
4.0000 mg | Freq: Once | INTRAMUSCULAR | Status: AC
  Administered 2016-03-11: 4 mg via INTRAVENOUS
  Filled 2016-03-11: qty 2

## 2016-03-11 NOTE — ED Notes (Signed)
Bed: ZO10WA22 Expected date:  Expected time:  Means of arrival:  Comments: EMS- 25 yo cramps w/ menstruation

## 2016-03-11 NOTE — Discharge Instructions (Signed)
Please read and follow all provided instructions.  Your diagnoses today include:  1. Non-intractable vomiting with nausea, unspecified vomiting type    Tests performed today include:  Vital signs. See below for your results today.   Medications prescribed:   Take as prescribed   Home care instructions:  Follow any educational materials contained in this packet.  Follow-up instructions: Please follow-up with your [facility] provider for further evaluation of symptoms and treatment   Return instructions:   Please return to the Emergency Department if you do not get better, if you get worse, or new symptoms OR  - Fever (temperature greater than 101.63F)  - Bleeding that does not stop with holding pressure to the area    -Severe pain (please note that you may be more sore the day after your accident)  - Chest Pain  - Difficulty breathing  - Severe nausea or vomiting  - Inability to tolerate food and liquids  - Passing out  - Skin becoming red around your wounds  - Change in mental status (confusion or lethargy)  - New numbness or weakness     Please return if you have any other emergent concerns.  Additional Information:  Your vital signs today were: BP 125/79 mmHg   Pulse 80   Temp(Src) 98.5 F (36.9 C) (Oral)   Resp 16   Ht 5\' 1"  (1.549 m)   Wt 45.36 kg   BMI 18.90 kg/m2   SpO2 100%   LMP 03/08/2016 If your blood pressure (BP) was elevated above 135/85 this visit, please have this repeated by your doctor within one month. ---------------

## 2016-03-11 NOTE — ED Notes (Signed)
Recollect sent to lab 

## 2016-03-11 NOTE — ED Notes (Addendum)
Per EMS pt reports ongoing emesis related to menstrual cycle onset Tuesday.

## 2016-03-11 NOTE — ED Provider Notes (Signed)
CSN: [id number]     Arrival date & time 03/11/16  0935 History   First MD Initiated Contact with Patient 03/11/16 [phone]     Chief Complaint  Patient presents with  . Emesis   (Consider location/radiation/quality/duration/timing/severity/associated sxs/prior Treatment) HPI 25 y.o. female presents to the Emergency Department today complaining of upper abdominal pain as well as emesis since Tuesday. Pt notes that she gets these symptoms when she is on her menstrual cycle. Has hx of the same with evaluations in the ED. Notes pain is 7/10 in her upper abdomen. Notes emesis with no hematemesis. No diarrhea. No fevers. OTC medication without relief. No other symptoms noted   Past Medical History  Diagnosis Date  . Medical history non-contributory   . Pneumothorax    Past Surgical History  Procedure Laterality Date  . No past surgeries     Family History  Problem Relation Age of Onset  . Hypertension Mother    Social History  Substance Use Topics  . Smoking status: Never Smoker   . Smokeless tobacco: Never Used  . Alcohol Use: No   OB History    Gravida Para Term Preterm AB TAB SAB Ectopic Multiple Living   0              Review of Systems ROS reviewed and all are negative for acute change except as noted in the HPI.  Allergies  Vancomycin  Home Medications   Prior to Admission medications   Medication Sig Start Date End Date Taking? Authorizing Provider  ibuprofen (ADVIL,MOTRIN) 200 MG tablet Take 600 mg by mouth every 6 (six) hours as needed for moderate pain.    Historical Provider, MD  metroNIDAZOLE (FLAGYL) 500 MG tablet Take 1 tablet (500 mg total) by mouth 2 (two) times daily. 12/21/15   Laurence Spatesachel Morgan Little, MD  naproxen sodium (ANAPROX) 220 MG tablet Take 660 mg by mouth every 8 (eight) hours as needed (for pain.).     Historical Provider, MD  promethazine (PHENERGAN) 25 MG tablet Take 1 tablet (25 mg total) by mouth every 6 (six) hours as needed for nausea or vomiting.  12/21/15   Ambrose Finlandachel Morgan Little, MD   BP 113/88 mmHg  Pulse 75  Temp(Src) 98.5 F (36.9 C) (Oral)  Resp 16  Ht 5\' 1"  (1.549 m)  Wt 45.36 kg  BMI 18.90 kg/m2  SpO2 100%  LMP 05/23/2017mmHg  Pulse 75  Temp(Src) 98.5 F (36.9 C) (Oral)  Resp 16  Ht 5\' 1"  (1.549 m)  Wt 45.36 kg  BMI 18.90 kg/m2  SpO2 100%  LMP 03/08/2016   Physical Exam  Constitutional: She is oriented to person, place, and time. She appears well-developed and well-nourished.  HENT:  Head: Normocephalic and atraumatic.  Eyes: EOM are normal. Pupils are equal, round, and reactive to light.  Neck: Normal range of motion. Neck supple. No tracheal deviation present.  Cardiovascular: Normal rate, regular rhythm, normal heart sounds and intact distal pulses.   No murmur heard. Pulmonary/Chest: Effort normal and breath sounds normal. No respiratory distress. She has no wheezes. She has no rales. She exhibits no tenderness.  Abdominal: Soft. Normal appearance and bowel sounds are normal. There is tenderness in the right upper quadrant, epigastric area and left upper quadrant. There is no rigidity, no rebound, no guarding, no tenderness at McBurney's point and negative Murphy's sign.  Musculoskeletal: Normal range of motion.  Neurological: She is alert and oriented to person, place, and time.  Skin: Skin is warm and  dry.  Psychiatric: She has a normal mood and affect. Her behavior is normal. Thought content normal.  Nursing note and vitals reviewed.  ED Course  Procedures (including critical care time) Labs Review Labs Reviewed - No data to display  Imaging Review No results found. I have personally reviewed and evaluated these images and lab results as part of my medical decision-making.   EKG Interpretation None      MDM  I have reviewed and evaluated the relevant laboratory values. I have reviewed the relevant previous healthcare records. I have reviewed EMS Documentation. I obtained HPI from historian.  ED  Course:  Assessment: Patient is a 24yF presents with upper abdominal pain since Tuesday. Nausea and vomiting after starting her menstrual period she reports history of same. No abnormalities regarding menstrual flow or pelvic pain. On exam, nontoxic, nonseptic appearing, in no apparent distress. Patient's pain and other symptoms adequately managed in emergency department.  Fluid bolus given with Zofran. Labs and vitals reviewed. Patient does not meet the SIRS or Sepsis criteria.  On repeat exam patient does not have a surgical abdomen and there are no peritoneal signs.  No indication of appendicitis, bowel obstruction, bowel perforation, cholecystitis, diverticulitis, PID or ectopic pregnancy. Patient discharged home with symptomatic treatment and given strict instructions for follow-up with GYN. Pt given referral previously, but unable to attend due to cost issues. Able to attend now. I have also discussed reasons to return immediately to the ER.  No active vomiting in ED. Tolerated PO. Patient expresses understanding and agrees with plan.  Disposition/Plan:  DC Home Additional Verbal discharge instructions given and discussed with patient.  Pt Instructed to f/u with PCP in the next week for evaluation and treatment of symptoms. Return precautions given Pt acknowledges and agrees with plan  Supervising Physician Azalia Bilis, MD   Final diagnoses:  None       Audry Pili, PA-C 03/11/16 1247  Azalia Bilis, MD 03/11/16 1251

## 2016-07-18 ENCOUNTER — Encounter (HOSPITAL_COMMUNITY): Payer: Self-pay | Admitting: Emergency Medicine

## 2016-07-18 ENCOUNTER — Emergency Department (HOSPITAL_COMMUNITY)
Admission: EM | Admit: 2016-07-18 | Discharge: 2016-07-18 | Disposition: A | Discharge status: Home / Self Care | Payer: No Typology Code available for payment source | Attending: Emergency Medicine | Admitting: Emergency Medicine

## 2016-07-18 DIAGNOSIS — R112 Nausea with vomiting, unspecified: Secondary | ICD-10-CM | POA: Insufficient documentation

## 2016-07-18 DIAGNOSIS — R197 Diarrhea, unspecified: Secondary | ICD-10-CM | POA: Insufficient documentation

## 2016-07-18 DIAGNOSIS — E876 Hypokalemia: Secondary | ICD-10-CM | POA: Insufficient documentation

## 2016-07-18 LAB — URINALYSIS, ROUTINE W REFLEX MICROSCOPIC
GLUCOSE, UA: NEGATIVE mg/dL
Ketones, ur: NEGATIVE mg/dL
Nitrite: NEGATIVE
PROTEIN: 100 mg/dL — AB
SPECIFIC GRAVITY, URINE: 1.038 — AB (ref 1.005–1.030)
pH: 6 (ref 5.0–8.0)

## 2016-07-18 LAB — COMPREHENSIVE METABOLIC PANEL
ALBUMIN: 5.8 g/dL — AB (ref 3.5–5.0)
ALK PHOS: 52 U/L (ref 38–126)
ALT: 29 U/L (ref 14–54)
AST: 31 U/L (ref 15–41)
Anion gap: 13 (ref 5–15)
BUN: 24 mg/dL — ABNORMAL HIGH (ref 6–20)
CHLORIDE: 101 mmol/L (ref 101–111)
CO2: 25 mmol/L (ref 22–32)
CREATININE: 0.84 mg/dL (ref 0.44–1.00)
Calcium: 10.3 mg/dL (ref 8.9–10.3)
GFR calc non Af Amer: >60 mL/min (ref >60)
GLUCOSE: 108 mg/dL — AB (ref 65–99)
Potassium: 2.8 mmol/L — ABNORMAL LOW (ref 3.5–5.1)
SODIUM: 139 mmol/L (ref 135–145)
Total Bilirubin: 1.5 mg/dL — ABNORMAL HIGH (ref 0.3–1.2)
Total Protein: 9.5 g/dL — ABNORMAL HIGH (ref 6.5–8.1)

## 2016-07-18 LAB — LIPASE, BLOOD: LIPASE: 21 U/L (ref 11–51)

## 2016-07-18 LAB — URINE MICROSCOPIC-ADD ON

## 2016-07-18 LAB — I-STAT BETA HCG BLOOD, ED (MC, WL, AP ONLY): I-stat hCG, quantitative: <5 m[IU]/mL (ref <5)

## 2016-07-18 LAB — CBC
HCT: 42.6 % (ref 36.0–46.0)
Hemoglobin: 14.7 g/dL (ref 12.0–15.0)
MCH: 31.5 pg (ref 26.0–34.0)
MCHC: 34.5 g/dL (ref 30.0–36.0)
MCV: 91.4 fL (ref 78.0–100.0)
PLATELETS: 298 10*3/uL (ref 150–400)
RBC: 4.66 MIL/uL (ref 3.87–5.11)
RDW: 12.6 % (ref 11.5–15.5)
WBC: 10.3 10*3/uL (ref 4.0–10.5)

## 2016-07-18 MED ORDER — ONDANSETRON 4 MG PO TBDP: 4.0000 mg | ORAL_TABLET | Freq: Three times a day (TID) | ORAL | 0 refills | Status: DC | PRN

## 2016-07-18 MED ORDER — SODIUM CHLORIDE 0.9 % IV BOLUS (SEPSIS)
1000.0000 mL | Freq: Once | INTRAVENOUS | Status: AC
Start: 2016-07-18 — End: 2016-07-18
  Administered 2016-07-18: 1000 mL via INTRAVENOUS

## 2016-07-18 MED ORDER — ONDANSETRON HCL 4 MG/2ML IJ SOLN
4.0000 mg | Freq: Once | INTRAMUSCULAR | Status: AC
  Administered 2016-07-18: 4 mg via INTRAVENOUS
  Filled 2016-07-18: qty 2

## 2016-07-18 MED ORDER — POTASSIUM CHLORIDE 10 MEQ/100ML IV SOLN
10.0000 meq | Freq: Once | INTRAVENOUS | Status: AC
  Administered 2016-07-18: 10 meq via INTRAVENOUS
  Filled 2016-07-18: qty 100

## 2016-07-18 MED ORDER — POTASSIUM CHLORIDE CRYS ER 20 MEQ PO TBCR
40.0000 meq | EXTENDED_RELEASE_TABLET | Freq: Once | ORAL | Status: AC
  Administered 2016-07-18: 40 meq via ORAL
  Filled 2016-07-18: qty 2

## 2016-07-18 NOTE — Discharge Instructions (Signed)
Take the prescribed medication as directed. °Follow-up with your primary care doctor. °Return to the ED for new or worsening symptoms. °

## 2016-07-18 NOTE — Progress Notes (Signed)
EDCM spoke to patient at bedside. Patient confirms she does [place] have a pcp [place].  [facility], informed patient of services there and walk in times.  EDCM also provided patient with list of pcps who accept self pay patients, list of discount pharmacies and websites needymeds.org and GoodRX.com for medication assistance, phone number to inquire about the orange card, phone number to inquire about Mediciad, phone number to inquire about the Affordable Care Act, financial resources in the community such as local churches, salvation army, urban ministries, and dental assistance for uninsured patients.  Patient thankful for resources.  No further EDCM needs at this time.  Patient listed as having [employer] insurance, however when filed patient comes up ineligible for coverage.  [facility] and she [facility]. EDCM informed patient to call the phone number on the back of her insurance card or go to insurance company web site to assist patient in finding a pcp who is close to her and within network.  Patient thankful for assistance.  No further EDCM needs at this time.

## 2016-07-18 NOTE — ED Provider Notes (Signed)
[facility]-EMERGENCY DEPT Provider Note   CSN: [id number] Arrival date & time: 07/18/16  1514     History   Chief Complaint Chief Complaint  Patient presents with  . Emesis  . Diarrhea    HPI [name] is a 25 y.o. female.  The history is provided by the patient and medical records.  Emesis   Associated symptoms include diarrhea.  Diarrhea   Associated symptoms include vomiting.    25 year old female with history of dysmenorrhea, presenting to the ED for nausea, vomiting, diarrhea. Patient states this occurs monthly correlating with her menstrual cycle. She denies any change in this. She denies any fever or chills. No sick contacts. No recent travel.  She denies any abdominal pain. Denies any weakness or feelings of syncope. States her OB/GYN did give her some Zofran, however has been unable to tolerate this orally.  Past Medical History:  Diagnosis Date  . Medical history non-contributory   . Pneumothorax     Patient Active Problem List   Diagnosis Date Noted  . Dysmenorrhea 07/01/2013  . Nausea & vomiting 09/22/2011  . 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 NO PAST SURGERIES      OB History    Gravida Para Term Preterm AB Living   0             SAB TAB Ectopic Multiple Live Births                   Home Medications    Prior to Admission medications   Medication Sig Start Date End Date Taking? Authorizing Provider  ibuprofen (ADVIL,MOTRIN) 200 MG tablet Take 400 mg by mouth every 6 (six) hours as needed for moderate pain.    Yes Historical Provider, MD  naproxen sodium (ANAPROX) 220 MG tablet Take 440 mg by mouth every 8 (eight) hours as needed (for pain.).    Yes Historical Provider, MD  ondansetron (ZOFRAN) 4 MG tablet Take 1 tablet (4 mg total) by mouth every 6 (six) hours. Patient taking differently: Take 4 mg by mouth every 6 (six) hours as needed for  nausea or vomiting.  03/11/16  Yes Audry Piliyler Mohr, PA-C    Family History Family History  Problem Relation Age of Onset  . Hypertension Mother     Social History Social History  Substance Use Topics  . Smoking status: Never Smoker  . Smokeless tobacco: Never Used  . Alcohol use No     Allergies   Vancomycin   Review of Systems Review of Systems  Gastrointestinal: Positive for diarrhea, nausea and vomiting.  All other systems reviewed and are negative.    Physical Exam Updated Vital Signs BP 101/69 (BP Location: Left Arm)   Pulse 72   Temp 98.4 F (36.9 C)   Resp 18   Ht 5' (1.524 m)   Wt 43.1 kg   LMP 07/15/2016   SpO2 98%   BMI 18.55 kg/m   Physical Exam  Constitutional: She is oriented to person, place, and time. She appears well-developed and well-nourished.  HENT:  Head: Normocephalic and atraumatic.  Mouth/Throat: Oropharynx is clear and moist.  Mildly dry mucous membranes  Eyes: Conjunctivae and EOM are normal. Pupils are equal, round, and reactive to light.  Neck: Normal range of motion.  Cardiovascular: Normal rate, regular rhythm and normal heart sounds.   Pulmonary/Chest: Effort normal and breath sounds normal.  Abdominal: Soft.  Bowel sounds are normal. She exhibits no mass. There is no tenderness. There is no rebound.  Soft, benign  Musculoskeletal: Normal range of motion.  Neurological: She is alert and oriented to person, place, and time.  Skin: Skin is warm and dry.  Psychiatric: She has a normal mood and affect.  Nursing note and vitals reviewed.    ED Treatments / Results  Labs (all labs ordered are listed, but only abnormal results are displayed) Labs Reviewed  COMPREHENSIVE METABOLIC PANEL - Abnormal; Notable for the following:       Result Value   Potassium 2.8 (*)    Glucose, Bld 108 (*)    BUN 24 (*)    Total Protein 9.5 (*)    Albumin 5.8 (*)    Total Bilirubin 1.5 (*)    All other components within normal limits    URINALYSIS, ROUTINE W REFLEX MICROSCOPIC (NOT AT [facility]) - Abnormal; Notable for the following:    Color, Urine AMBER (*)    APPearance CLOUDY (*)    Specific Gravity, Urine 1.038 (*)    Hgb urine dipstick LARGE (*)    Bilirubin Urine SMALL (*)    Protein, ur 100 (*)    Leukocytes, UA SMALL (*)    All other components within normal limits  URINE MICROSCOPIC-ADD ON - Abnormal; Notable for the following:    Squamous Epithelial / LPF 6-30 (*)    Bacteria, UA MANY (*)    All other components within normal limits  LIPASE, BLOOD  CBC  I-STAT BETA HCG BLOOD, ED ([facility], [facility], [facility] ONLY)    EKG  EKG Interpretation None       Radiology No results found.  Procedures Procedures (including critical care time)  Medications Ordered in ED Medications  sodium chloride 0.9 % bolus 1,000 mL (1,000 mLs Intravenous New Bag/Given 07/18/16 1840)  ondansetron (ZOFRAN) injection 4 mg (4 mg Intravenous Given 07/18/16 1840)  potassium chloride 10 mEq in 100 mL IVPB (10 mEq Intravenous New Bag/Given 07/18/16 1845)  potassium chloride SA (K-DUR,KLOR-CON) CR tablet 40 mEq (40 mEq Oral Given 07/18/16 1950)     Initial Impression / Assessment and Plan / ED Course  I have reviewed the triage vital signs and the nursing notes.  Pertinent labs & imaging results that were available during my care of the patient were reviewed by me and considered in my medical decision making (see chart for details).  Clinical Course   25 year old female who with nausea, vomiting, diarrhea. Reports this happens monthly coinciding with her menstrual cycle. States current symptoms are typical of when this happens.  She is afebrile and nontoxic. Abdomen is soft and benign. Screening labs with hypokalemia, history of same. UA appears contaminated, no urinary symptoms.  She was treated here with IV fluids as well as IV and oral potassium supplementation.  She is not tolerating oral fluids well.  Appears stable for discharge.  Follow-up  with PCP.  Discussed plan with patient, she acknowledged understanding and agreed with plan of care.  Return precautions given for new or worsening symptoms.  Final Clinical Impressions(s) / ED Diagnoses   Final diagnoses:  Nausea vomiting and diarrhea  Hypokalemia    New Prescriptions New Prescriptions   ONDANSETRON (ZOFRAN ODT) 4 MG DISINTEGRATING TABLET    Take 1 tablet (4 mg total) by mouth every 8 (eight) hours as needed for nausea.     Garlon Hatchet, PA-C 07/18/16 2045    Doug Sou, MD 07/19/16 Marlyne Beards

## 2016-07-18 NOTE — ED Triage Notes (Signed)
Pt reports n/v/d onset Saturday consistent with menstrual cycle. Pt denies pain.

## 2016-07-18 NOTE — ED Notes (Signed)
Pt given water and orange juice  

## 2016-11-29 ENCOUNTER — Encounter (HOSPITAL_COMMUNITY): Payer: Self-pay | Admitting: Emergency Medicine

## 2016-11-29 ENCOUNTER — Emergency Department (HOSPITAL_COMMUNITY)
Admission: EM | Admit: 2016-11-29 | Discharge: 2016-11-29 | Disposition: A | Discharge status: Home / Self Care | Payer: No Typology Code available for payment source | Attending: Emergency Medicine | Admitting: Emergency Medicine

## 2016-11-29 DIAGNOSIS — Z79899 Other long term (current) drug therapy: Secondary | ICD-10-CM | POA: Insufficient documentation

## 2016-11-29 DIAGNOSIS — R112 Nausea with vomiting, unspecified: Secondary | ICD-10-CM | POA: Insufficient documentation

## 2016-11-29 LAB — URINALYSIS, ROUTINE W REFLEX MICROSCOPIC
BILIRUBIN URINE: NEGATIVE
Glucose, UA: 50 mg/dL — AB
KETONES UR: 5 mg/dL — AB
LEUKOCYTES UA: NEGATIVE
Nitrite: NEGATIVE
PH: 5 (ref 5.0–8.0)
Protein, ur: 100 mg/dL — AB
SPECIFIC GRAVITY, URINE: 1.032 — AB (ref 1.005–1.030)

## 2016-11-29 LAB — COMPREHENSIVE METABOLIC PANEL
ALT: 24 U/L (ref 14–54)
ANION GAP: 15 (ref 5–15)
AST: 30 U/L (ref 15–41)
Albumin: 5.1 g/dL — ABNORMAL HIGH (ref 3.5–5.0)
Alkaline Phosphatase: 60 U/L (ref 38–126)
BUN: 24 mg/dL — ABNORMAL HIGH (ref 6–20)
CHLORIDE: 103 mmol/L (ref 101–111)
CO2: 22 mmol/L (ref 22–32)
CREATININE: 0.91 mg/dL (ref 0.44–1.00)
Calcium: 10.8 mg/dL — ABNORMAL HIGH (ref 8.9–10.3)
Glucose, Bld: 118 mg/dL — ABNORMAL HIGH (ref 65–99)
POTASSIUM: 3.7 mmol/L (ref 3.5–5.1)
SODIUM: 140 mmol/L (ref 135–145)
Total Bilirubin: 1.1 mg/dL (ref 0.3–1.2)
Total Protein: 8.3 g/dL — ABNORMAL HIGH (ref 6.5–8.1)

## 2016-11-29 LAB — CBC
HCT: 41.1 % (ref 36.0–46.0)
HEMOGLOBIN: 14.1 g/dL (ref 12.0–15.0)
MCH: 31.8 pg (ref 26.0–34.0)
MCHC: 34.3 g/dL (ref 30.0–36.0)
MCV: 92.8 fL (ref 78.0–100.0)
PLATELETS: 356 10*3/uL (ref 150–400)
RBC: 4.43 MIL/uL (ref 3.87–5.11)
RDW: 13.2 % (ref 11.5–15.5)
WBC: 15.3 10*3/uL — AB (ref 4.0–10.5)

## 2016-11-29 LAB — POC URINE PREG, ED: PREG TEST UR: NEGATIVE

## 2016-11-29 LAB — LIPASE, BLOOD: LIPASE: 20 U/L (ref 11–51)

## 2016-11-29 MED ORDER — SODIUM CHLORIDE 0.9 % IV BOLUS (SEPSIS)
1000.0000 mL | Freq: Once | INTRAVENOUS | Status: AC
  Administered 2016-11-29: 1000 mL via INTRAVENOUS

## 2016-11-29 MED ORDER — ONDANSETRON 4 MG PO TBDP: 4.0000 mg | ORAL_TABLET | Freq: Three times a day (TID) | ORAL | 0 refills | Status: DC | PRN

## 2016-11-29 MED ORDER — ONDANSETRON 4 MG PO TBDP
4.0000 mg | ORAL_TABLET | Freq: Once | ORAL | Status: AC | PRN
  Administered 2016-11-29: 4 mg via ORAL

## 2016-11-29 MED ORDER — ONDANSETRON 4 MG PO TBDP
ORAL_TABLET | ORAL | Status: AC
  Filled 2016-11-29: qty 1

## 2016-11-29 NOTE — ED Triage Notes (Signed)
Onset one day ago multiple episodes of emesis continued today. Denies pain. States "can not keep anything down" Airway intact bilateral equal chest rise and fall.

## 2016-11-29 NOTE — ED Provider Notes (Addendum)
[facility]-EMERGENCY DEPT Provider Note   CSN: [id number] Arrival date & time: 11/29/16  0902     History   Chief Complaint Chief Complaint  Patient presents with  . Emesis    HPI [name] is a 26 y.o. female.  26 yo F with a chief complaint of nausea and vomiting. The started yesterday. Patient said she had a baconator and then started having some increased emesis. Having some cramping with vomiting. Otherwise denies abdominal pain. Denies diarrhea. Denies sick contacts. Denies fevers or chills. Denies bilious or bloody emesis.   The history is provided by the patient.  Emesis   This is a new problem. The current episode started yesterday. The problem occurs 2 to 4 times per day. The problem has not changed since onset.The emesis has an appearance of stomach contents. There has been no fever. Pertinent negatives include no arthralgias, no chills, no fever, no headaches and no myalgias. Risk factors: none.    Past Medical History:  Diagnosis Date  . Medical history non-contributory   . Pneumothorax     Patient Active Problem List   Diagnosis Date Noted  . Dysmenorrhea 07/01/2013  . Nausea & vomiting 09/22/2011  . 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 NO PAST SURGERIES      OB History    Gravida Para Term Preterm AB Living   0             SAB TAB Ectopic Multiple Live Births                   Home Medications    Prior to Admission medications   Medication Sig Start Date End Date Taking? Authorizing Provider  Ibuprofen (MIDOL) 200 MG CAPS Take 1 capsule by mouth every 6 (six) hours as needed. pain   Yes Historical Provider, MD  ibuprofen (ADVIL,MOTRIN) 200 MG tablet Take 400 mg by mouth every 6 (six) hours as needed for moderate pain.     Historical Provider, MD  naproxen sodium (ANAPROX) 220 MG tablet Take 440 mg by mouth every 8 (eight) hours as needed (for  pain.).     Historical Provider, MD  ondansetron (ZOFRAN ODT) 4 MG disintegrating tablet Take 1 tablet (4 mg total) by mouth every 8 (eight) hours as needed for nausea or vomiting. 11/29/16   Melene Plan, DO  ondansetron (ZOFRAN) 4 MG tablet Take 1 tablet (4 mg total) by mouth every 6 (six) hours. Patient not taking: Reported on 11/29/2016 03/11/16   Audry Pili, PA-C    Family History Family History  Problem Relation Age of Onset  . Hypertension Mother     Social History Social History  Substance Use Topics  . Smoking status: Never Smoker  . Smokeless tobacco: Never Used  . Alcohol use No     Allergies   Vancomycin   Review of Systems Review of Systems  Constitutional: Negative for chills and fever.  HENT: Negative for congestion and rhinorrhea.   Eyes: Negative for redness and visual disturbance.  Respiratory: Negative for shortness of breath and wheezing.   Cardiovascular: Negative for chest pain and palpitations.  Gastrointestinal: Positive for nausea and vomiting.  Genitourinary: Negative for dysuria and urgency.  Musculoskeletal: Negative for arthralgias and myalgias.  Skin: Negative for pallor and wound.  Neurological: Negative for dizziness and headaches.     Physical Exam Updated Vital Signs BP 137/97 (BP Location: Left  Arm)   Pulse 112   Temp 98.6 F (37 C) (Oral)   Resp 20   Ht 5' (1.524 m)   Wt 100 lb (45.4 kg)   LMP 11/17/2016   SpO2 99%   BMI 19.53 kg/m   Physical Exam  Constitutional: She is oriented to person, place, and time. She appears well-developed and well-nourished. No distress.  HENT:  Head: Normocephalic and atraumatic.  Eyes: EOM are normal. Pupils are equal, round, and reactive to light.  Neck: Normal range of motion. Neck supple.  Cardiovascular: Normal rate and regular rhythm.  Exam reveals no gallop and no friction rub.   No murmur heard. Pulmonary/Chest: Effort normal. She has no wheezes. She has no rales.  Abdominal: Soft. She  exhibits no distension and no mass. There is tenderness (mild diffuse). There is no guarding.  Musculoskeletal: She exhibits no edema or tenderness.  Neurological: She is alert and oriented to person, place, and time.  Skin: Skin is warm and dry. She is not diaphoretic.  Psychiatric: She has a normal mood and affect. Her behavior is normal.  Nursing note and vitals reviewed.    ED Treatments / Results  Labs (all labs ordered are listed, but only abnormal results are displayed) Labs Reviewed  COMPREHENSIVE METABOLIC PANEL - Abnormal; Notable for the following:       Result Value   Glucose, Bld 118 (*)    BUN 24 (*)    Calcium 10.8 (*)    Total Protein 8.3 (*)    Albumin 5.1 (*)    All other components within normal limits  CBC - Abnormal; Notable for the following:    WBC 15.3 (*)    All other components within normal limits  URINALYSIS, ROUTINE W REFLEX MICROSCOPIC - Abnormal; Notable for the following:    APPearance HAZY (*)    Specific Gravity, Urine 1.032 (*)    Glucose, UA 50 (*)    Hgb urine dipstick SMALL (*)    Ketones, ur 5 (*)    Protein, ur 100 (*)    Bacteria, UA FEW (*)    Squamous Epithelial / LPF 6-30 (*)    All other components within normal limits  LIPASE, BLOOD  POC URINE PREG, ED    EKG  EKG Interpretation None       Radiology No results found.  Procedures Procedures (including critical care time)  Medications Ordered in ED Medications  ondansetron (ZOFRAN-ODT) 4 MG disintegrating tablet (not administered)  ondansetron (ZOFRAN-ODT) disintegrating tablet 4 mg (4 mg Oral Given 11/29/16 0910)  sodium chloride 0.9 % bolus 1,000 mL (1,000 mLs Intravenous New Bag/Given 11/29/16 1030)     Initial Impression / Assessment and Plan / ED Course  I have reviewed the triage vital signs and the nursing notes.  Pertinent labs & imaging results that were available during my care of the patient were reviewed by me and considered in my medical decision  making (see chart for details).     26 yo F With nausea and vomiting. Patient was given Zofran in triage and feels much better. Is able to tolerate by mouth without difficulty. Labs reassuring. Discharge home.  11:01 AM:  I have discussed the diagnosis/risks/treatment options with the patient and believe the pt to be eligible for discharge home to follow-up with PCP. We also discussed returning to the ED immediately if new or worsening sx occur. We discussed the sx which are most concerning (e.g., sudden worsening pain, fever, inability to tolerate by  mouth) that necessitate immediate return. Medications administered to the patient during their visit and any new prescriptions provided to the patient are listed below.  Medications given during this visit Medications  ondansetron (ZOFRAN-ODT) 4 MG disintegrating tablet (not administered)  ondansetron (ZOFRAN-ODT) disintegrating tablet 4 mg (4 mg Oral Given 11/29/16 0910)  sodium chloride 0.9 % bolus 1,000 mL (1,000 mLs Intravenous New Bag/Given 11/29/16 1030)     The patient appears reasonably screen and/or stabilized for discharge and I doubt any other medical condition or other [facility] requiring further screening, evaluation, or treatment in the ED at this time prior to discharge.    Final Clinical Impressions(s) / ED Diagnoses   Final diagnoses:  Nausea and vomiting, intractability of vomiting not specified, unspecified vomiting type    New Prescriptions New Prescriptions   ONDANSETRON (ZOFRAN ODT) 4 MG DISINTEGRATING TABLET    Take 1 tablet (4 mg total) by mouth every 8 (eight) hours as needed for nausea or vomiting.     Melene Plan, DO 11/29/16 1057    Melene Plan, DO 11/29/16 1101

## 2018-05-31 DIAGNOSIS — N946 Dysmenorrhea, unspecified: Secondary | ICD-10-CM | POA: Insufficient documentation

## 2018-05-31 DIAGNOSIS — E860 Dehydration: Secondary | ICD-10-CM | POA: Insufficient documentation

## 2018-06-01 ENCOUNTER — Emergency Department (HOSPITAL_COMMUNITY): Payer: Self-pay

## 2018-06-01 ENCOUNTER — Encounter (HOSPITAL_COMMUNITY): Payer: Self-pay | Admitting: *Deleted

## 2018-06-01 ENCOUNTER — Emergency Department (HOSPITAL_COMMUNITY)
Admission: EM | Admit: 2018-06-01 | Discharge: 2018-06-01 | Disposition: A | Discharge status: Home / Self Care | Payer: Self-pay | Attending: Emergency Medicine | Admitting: Emergency Medicine

## 2018-06-01 DIAGNOSIS — E860 Dehydration: Secondary | ICD-10-CM

## 2018-06-01 DIAGNOSIS — N946 Dysmenorrhea, unspecified: Secondary | ICD-10-CM

## 2018-06-01 DIAGNOSIS — R112 Nausea with vomiting, unspecified: Secondary | ICD-10-CM

## 2018-06-01 LAB — URINALYSIS, ROUTINE W REFLEX MICROSCOPIC
BILIRUBIN URINE: NEGATIVE
Bacteria, UA: NONE SEEN
GLUCOSE, UA: 150 mg/dL — AB
KETONES UR: 80 mg/dL — AB
NITRITE: NEGATIVE
PH: 5 (ref 5.0–8.0)
Protein, ur: 30 mg/dL — AB
SPECIFIC GRAVITY, URINE: 1.027 (ref 1.005–1.030)

## 2018-06-01 LAB — I-STAT BETA HCG BLOOD, ED (MC, WL, AP ONLY): I-stat hCG, quantitative: <5 m[IU]/mL (ref <5)

## 2018-06-01 LAB — COMPREHENSIVE METABOLIC PANEL
ALT: 16 U/L (ref 0–44)
AST: 26 U/L (ref 15–41)
Albumin: 5 g/dL (ref 3.5–5.0)
Alkaline Phosphatase: 56 U/L (ref 38–126)
Anion gap: 13 (ref 5–15)
BILIRUBIN TOTAL: 1.6 mg/dL — AB (ref 0.3–1.2)
BUN: 15 mg/dL (ref 6–20)
CHLORIDE: 107 mmol/L (ref 98–111)
CO2: 24 mmol/L (ref 22–32)
CREATININE: 0.87 mg/dL (ref 0.44–1.00)
Calcium: 10.1 mg/dL (ref 8.9–10.3)
Glucose, Bld: 196 mg/dL — ABNORMAL HIGH (ref 70–99)
Potassium: 3.3 mmol/L — ABNORMAL LOW (ref 3.5–5.1)
Sodium: 144 mmol/L (ref 135–145)
TOTAL PROTEIN: 8.4 g/dL — AB (ref 6.5–8.1)

## 2018-06-01 LAB — LIPASE, BLOOD: LIPASE: 22 U/L (ref 11–51)

## 2018-06-01 LAB — CBC
HCT: 39.8 % (ref 36.0–46.0)
Hemoglobin: 13.4 g/dL (ref 12.0–15.0)
MCH: 32.1 pg (ref 26.0–34.0)
MCHC: 33.7 g/dL (ref 30.0–36.0)
MCV: 95.4 fL (ref 78.0–100.0)
PLATELETS: 307 10*3/uL (ref 150–400)
RBC: 4.17 MIL/uL (ref 3.87–5.11)
RDW: 13.8 % (ref 11.5–15.5)
WBC: 13.6 10*3/uL — AB (ref 4.0–10.5)

## 2018-06-01 MED ORDER — POTASSIUM CHLORIDE CRYS ER 20 MEQ PO TBCR
40.0000 meq | EXTENDED_RELEASE_TABLET | Freq: Once | ORAL | Status: AC
  Administered 2018-06-01: 40 meq via ORAL
  Filled 2018-06-01: qty 2

## 2018-06-01 MED ORDER — KETOROLAC TROMETHAMINE 30 MG/ML IJ SOLN
30.0000 mg | Freq: Once | INTRAMUSCULAR | Status: AC
  Administered 2018-06-01: 30 mg via INTRAVENOUS
  Filled 2018-06-01: qty 1

## 2018-06-01 MED ORDER — ONDANSETRON 4 MG PO TBDP
4.0000 mg | ORAL_TABLET | Freq: Once | ORAL | Status: DC | PRN
  Filled 2018-06-01: qty 1

## 2018-06-01 MED ORDER — PROMETHAZINE HCL 25 MG/ML IJ SOLN
25.0000 mg | Freq: Once | INTRAMUSCULAR | Status: AC
  Administered 2018-06-01: 25 mg via INTRAVENOUS
  Filled 2018-06-01: qty 1

## 2018-06-01 MED ORDER — PROMETHAZINE HCL 25 MG PO TABS: 25.0000 mg | ORAL_TABLET | Freq: Four times a day (QID) | ORAL | 0 refills | Status: DC | PRN

## 2018-06-01 MED ORDER — SODIUM CHLORIDE 0.9 % IV BOLUS
1000.0000 mL | Freq: Once | INTRAVENOUS | Status: AC
  Administered 2018-06-01: 1000 mL via INTRAVENOUS

## 2018-06-01 MED ORDER — ONDANSETRON HCL 4 MG/2ML IJ SOLN
4.0000 mg | Freq: Once | INTRAMUSCULAR | Status: AC
Start: 2018-06-01 — End: 2018-06-01
  Administered 2018-06-01: 4 mg via INTRAVENOUS
  Filled 2018-06-01: qty 2

## 2018-06-01 NOTE — ED Triage Notes (Signed)
Pt c/o nausea/vomiting starting yesterday, when her menstruation started, per pt she has the same symptoms every month when on period: nausea, vomiting, abdominal pain. P{t c/o feeling hot, she is writhing in pain on the floor because "floor is cold".

## 2018-06-01 NOTE — ED Notes (Signed)
Pt stating she cannot tolerating taking the potassium pills until she stops feeling nauseous, IV Zofran given

## 2018-06-01 NOTE — ED Notes (Signed)
Made pt aware we need a urine sample, pt refused getting up to used the bathroom, bedpan, and in and out cath. States she doesn't feel like urinating. Mother convinced pt to get up and walk to restroom. Pt attempted to lay on floor, mother assisted to restroom with staff. Clean catch obtained.

## 2018-06-01 NOTE — Discharge Instructions (Addendum)
1. Medications: phenergan for vomiting, usual home medications 2. Treatment: rest, drink plenty of fluids,  3. Follow Up: Please followup with your primary doctor in 2-3 days and OB-GYN in 1 week for discussion of your diagnoses and further evaluation after today's visit; if you do not have a primary care doctor use the resource guide provided to find one; Please return to the ER for fever, persistent vomiting, worsening pain or other concerns

## 2018-06-01 NOTE — ED Provider Notes (Signed)
[facility]-EMERGENCY DEPT Provider Note   CSN: [id number] Arrival date & time: 05/31/18  2305     History   Chief Complaint Chief Complaint  Patient presents with  . Emesis  . Abdominal Pain    HPI [name] is a 27 y.o. female with a hx of pneumothorax presents to the Emergency Department complaining of gradual, persistent, progressively worsening generalized abdominal pain, nausea and vomiting onset 8 AM yesterday morning.  Patient is accompanied by her girlfriend who provides some of the history.  Patient's girlfriend reports that she has been having symptoms like this for almost 2 years.  She reports that on the day that her menstrual cycle begins the patient has a vomiting, abdominal pain and weakness.  She reports that the symptoms usually last several days and then resolve completely.  She reports that the patient often has to miss work due to this.  In addition patient complains of chills throughout this time.  Patient and girlfriend report that she has not seen an OB/GYN for this.  She is not currently on any oral contraceptive.  Nothing seems to make the symptoms better or worse.  Last episode of emesis was in the lobby.  She and her girlfriend report emesis is nonbloody and nonbilious.  No previous abdominal surgeries.  No fevers or chills, headache, neck pain, chest pain, shortness of breath, syncope, dysuria.  The history is provided by the patient and medical records. No language interpreter was used.    Past Medical History:  Diagnosis Date  . Medical history non-contributory   . Pneumothorax     Patient Active Problem List   Diagnosis Date Noted  . Dysmenorrhea 07/01/2013  . Nausea & vomiting 09/22/2011  . 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 NO PAST SURGERIES       OB History    Gravida  0   Para      Term      Preterm      AB      Living        SAB      TAB      Ectopic      Multiple      Live Births               Home Medications    Prior to Admission medications   Medication Sig Start Date End Date Taking? Authorizing Provider  ibuprofen (ADVIL,MOTRIN) 200 MG tablet Take 400 mg by mouth every 6 (six) hours as needed for moderate pain.     [provider]  Ibuprofen (MIDOL) 200 MG CAPS Take 1 capsule by mouth every 6 (six) hours as needed. pain    [provider]  naproxen sodium (ANAPROX) 220 MG tablet Take 440 mg by mouth every 8 (eight) hours as needed (for pain.).     [provider]  ondansetron (ZOFRAN ODT) 4 MG disintegrating tablet Take 1 tablet (4 mg total) by mouth every 8 (eight) hours as needed for nausea or vomiting. 11/29/16   Melene Plan, DO  ondansetron (ZOFRAN) 4 MG tablet Take 1 tablet (4 mg total) by mouth every 6 (six) hours. Patient not taking: Reported on 11/29/2016 03/11/16   Audry Pili, PA-C  promethazine (PHENERGAN) 25 MG tablet Take 1 tablet (25 mg total) by mouth every 6 (six) hours as needed for nausea or vomiting. 06/01/18   Yumiko Alkins,  Dahlia Client, PA-C    Family History Family History  Problem Relation Age of Onset  . Hypertension Mother     Social History Social History   Tobacco Use  . Smoking status: Never Smoker  . Smokeless tobacco: Never Used  Substance Use Topics  . Alcohol use: No  . Drug use: Yes    Types: Marijuana     Allergies   Vancomycin   Review of Systems Review of Systems  Constitutional: Positive for chills. Negative for appetite change, diaphoresis, fatigue, fever and unexpected weight change.  HENT: Negative for mouth sores.   Eyes: Negative for visual disturbance.  Respiratory: Negative for cough, chest tightness, shortness of breath and wheezing.   Cardiovascular: Negative for chest pain.  Gastrointestinal: Positive for abdominal pain, nausea and vomiting. Negative for constipation and diarrhea.    Endocrine: Negative for polydipsia, polyphagia and polyuria.  Genitourinary: Negative for dysuria, frequency, hematuria and urgency.  Musculoskeletal: Negative for back pain and neck stiffness.  Skin: Negative for rash.  Allergic/Immunologic: Negative for immunocompromised state.  Neurological: Negative for syncope, light-headedness and headaches.  Hematological: Does not bruise/bleed easily.  Psychiatric/Behavioral: Negative for sleep disturbance. The patient is not nervous/anxious.      Physical Exam Updated Vital Signs BP 138/75 (BP Location: Left Arm)   Pulse (!) 49   Temp 99.1 F (37.3 C) (Oral)   Resp 16   LMP 05/31/2018 (Exact Date)   SpO2 100%   Physical Exam  Constitutional: She appears well-developed and well-nourished. She appears distressed.  Awake, alert, Appears distressed, rolling on the bed.  HENT:  Head: Normocephalic and atraumatic.  Mouth/Throat: Oropharynx is clear and moist. No oropharyngeal exudate.  Eyes: Conjunctivae are normal. No scleral icterus.  Neck: Normal range of motion. Neck supple.  Cardiovascular: Normal rate, regular rhythm and intact distal pulses.  Pulmonary/Chest: Effort normal and breath sounds normal. No respiratory distress. She has no wheezes.  Equal chest expansion  Abdominal: Soft. Bowel sounds are normal. She exhibits no mass. There is generalized tenderness. There is no rigidity, no rebound, no guarding, no CVA tenderness, no tenderness at McBurney's point and negative Murphy's sign.  Musculoskeletal: Normal range of motion. She exhibits no edema.  Neurological: She is alert.  Speech is clear and goal oriented Moves extremities without ataxia  Skin: Skin is warm and dry. She is not diaphoretic.  Psychiatric: She has a normal mood and affect.  Nursing note and vitals reviewed.    ED Treatments / Results  Labs (all labs ordered are listed, but only abnormal results are displayed) Labs Reviewed  COMPREHENSIVE METABOLIC  PANEL - Abnormal; Notable for the following components:      Result Value   Potassium 3.3 (*)    Glucose, Bld 196 (*)    Total Protein 8.4 (*)    Total Bilirubin 1.6 (*)    All other components within normal limits  CBC - Abnormal; Notable for the following components:   WBC 13.6 (*)    All other components within normal limits  URINALYSIS, ROUTINE W REFLEX MICROSCOPIC - Abnormal; Notable for the following components:   APPearance HAZY (*)    Glucose, UA 150 (*)    Hgb urine dipstick LARGE (*)    Ketones, ur 80 (*)    Protein, ur 30 (*)    Leukocytes, UA TRACE (*)    RBC / HPF >50 (*)    All other components within normal limits  LIPASE, BLOOD  I-STAT BETA HCG BLOOD, ED ([facility], [facility],  [facility] ONLY)    Radiology [place] Pelvis Complete  Result Date: 06/01/2018 CLINICAL DATA:  Initial evaluation for acute lower abdominal pain, vaginal bleeding. EXAM: TRANSABDOMINAL ULTRASOUND OF PELVIS DOPPLER ULTRASOUND OF OVARIES TECHNIQUE: Transabdominal ultrasound examination of the pelvis was performed including evaluation of the uterus, ovaries, adnexal regions, and pelvic cul-de-sac. Color and duplex Doppler ultrasound was utilized to evaluate blood flow to the ovaries. COMPARISON:  Prior ultrasound from 05/20/2013 FINDINGS: Uterus Measurements: 6.3 x 2.9 x 4.3 cm. No fibroids or other mass visualized. Endometrium Thickness: 5.8 mm.  No focal abnormality visualized. Right ovary Measurements: 2.7 x 2.0 x 3.0 cm. Normal appearance/no adnexal mass. Left ovary Measurements: 3.1 x 2.3 x 2.3 cm. 1.5 x 1.4 x 1.3 cm simple cyst, most consistent with a normal physiologic cyst/dominant follicle. Pulsed Doppler evaluation demonstrates normal low-resistance arterial and venous waveforms in both ovaries. Other: No free fluid within the pelvis. IMPRESSION: 1. 1.5 cm simple left ovarian cyst, most consistent with a normal physiologic cyst/dominant follicle. 2. Otherwise unremarkable pelvic ultrasound. No evidence for torsion or  other acute abnormality. Electronically Signed   By: Rise MuBenjamin  McClintock M.D.   On: 06/01/2018 04:58   [place] Art/ven Flow Abd Pelv Doppler  Result Date: 06/01/2018 CLINICAL DATA:  Initial evaluation for acute lower abdominal pain, vaginal bleeding. EXAM: TRANSABDOMINAL ULTRASOUND OF PELVIS DOPPLER ULTRASOUND OF OVARIES TECHNIQUE: Transabdominal ultrasound examination of the pelvis was performed including evaluation of the uterus, ovaries, adnexal regions, and pelvic cul-de-sac. Color and duplex Doppler ultrasound was utilized to evaluate blood flow to the ovaries. COMPARISON:  Prior ultrasound from 05/20/2013 FINDINGS: Uterus Measurements: 6.3 x 2.9 x 4.3 cm. No fibroids or other mass visualized. Endometrium Thickness: 5.8 mm.  No focal abnormality visualized. Right ovary Measurements: 2.7 x 2.0 x 3.0 cm. Normal appearance/no adnexal mass. Left ovary Measurements: 3.1 x 2.3 x 2.3 cm. 1.5 x 1.4 x 1.3 cm simple cyst, most consistent with a normal physiologic cyst/dominant follicle. Pulsed Doppler evaluation demonstrates normal low-resistance arterial and venous waveforms in both ovaries. Other: No free fluid within the pelvis. IMPRESSION: 1. 1.5 cm simple left ovarian cyst, most consistent with a normal physiologic cyst/dominant follicle. 2. Otherwise unremarkable pelvic ultrasound. No evidence for torsion or other acute abnormality. Electronically Signed   By: Rise MuBenjamin  McClintock M.D.   On: 06/01/2018 04:58    Procedures Procedures (including critical care time)  Medications Ordered in ED Medications  ondansetron (ZOFRAN-ODT) disintegrating tablet 4 mg (has no administration in time range)  sodium chloride 0.9 % bolus 1,000 mL (1,000 mLs Intravenous New Bag/Given 06/01/18 0549)  potassium chloride SA (K-DUR,KLOR-CON) CR tablet 40 mEq (0 mEq Oral Hold 06/01/18 0546)  sodium chloride 0.9 % bolus 1,000 mL (0 mLs Intravenous Stopped 06/01/18 0607)  ketorolac (TORADOL) 30 MG/ML injection 30 mg (30 mg  Intravenous Given 06/01/18 0345)  promethazine (PHENERGAN) injection 25 mg (25 mg Intravenous Given 06/01/18 0345)  ondansetron (ZOFRAN) injection 4 mg (4 mg Intravenous Given 06/01/18 0548)     Initial Impression / Assessment and Plan / ED Course  I have reviewed the triage vital signs and the nursing notes.  Pertinent labs & imaging results that were available during my care of the patient were reviewed by me and considered in my medical decision making (see chart for details).  Clinical Course as of Jun 01 632  Fri Jun 01, 2018  0527 hypokalemia, potassium ordered  Potassium(!): 3.3 [HM]  0528 Mild leukocytosis.  WBC(!): 13.6 [HM]  0629 Pt has tolerated liquids  without difficulty   [HM]    Clinical Course User Index [HM] Alegra Rost, Boyd KerbsHannah, PA-C    Presents with abdominal pain, nausea and vomiting in the setting of menstruation.  Patient appears very uncomfortable but is otherwise well-appearing.  Urinalysis with hemoglobin but no specific evidence of urinary tract infection.  Patient denies dysuria.  She does have mild hypokalemia.  Potassium ordered.  Patency test is negative.  Mild leukocytosis is noted however suspect this is reactive secondary to her persistent vomiting.  Ultrasound shows small left ovarian cyst most consistent with a follicle without free fluid.  No evidence of ovarian torsion.  No fibroids.  5:26 AM Patient reports she is feeling better but continues to have some nausea.  Will give additional fluids, Zofran and p.o. Trial.  Labs and findings discussed with patient and significant other.  Symptoms will be controlled here in the emergency department and patient will be discharged home for close follow-up with OB/GYN for further evaluation and treatment.  6:33 AM Pt reports she continues to feel better. She has tolerated liquids without vomiting. Abd remains soft.  I doubt appendicitis, cholecystitis, pancreatitis.    Final Clinical Impressions(s) / ED  Diagnoses   Final diagnoses:  Non-intractable vomiting with nausea, unspecified vomiting type  Dysmenorrhea  Dehydration    ED Discharge Orders         Ordered    promethazine (PHENERGAN) 25 MG tablet  Every 6 hours PRN     06/01/18 0631           Sherrell Farish, Dahlia ClientHannah, PA-C 06/01/18 [id number]    Azalia Bilisampos, Kevin, MD 06/01/18 [phone]

## 2018-06-01 NOTE — ED Notes (Signed)
Ultrasound called, states pt refused transvaginal ultrasound

## 2018-06-01 NOTE — ED Notes (Signed)
Pt tolerating sips with no difficulty

## 2018-06-01 NOTE — ED Notes (Signed)
Patient transported to Ultrasound 

## 2019-01-19 ENCOUNTER — Emergency Department (HOSPITAL_COMMUNITY): Payer: Self-pay

## 2019-01-19 ENCOUNTER — Other Ambulatory Visit: Payer: Self-pay

## 2019-01-19 ENCOUNTER — Encounter (HOSPITAL_COMMUNITY): Payer: Self-pay

## 2019-01-19 ENCOUNTER — Emergency Department (HOSPITAL_COMMUNITY)
Admission: EM | Admit: 2019-01-19 | Discharge: 2019-01-19 | Disposition: A | Discharge status: Home / Self Care | Payer: Self-pay | Attending: Emergency Medicine | Admitting: Emergency Medicine

## 2019-01-19 DIAGNOSIS — Z79899 Other long term (current) drug therapy: Secondary | ICD-10-CM | POA: Insufficient documentation

## 2019-01-19 DIAGNOSIS — E876 Hypokalemia: Secondary | ICD-10-CM | POA: Insufficient documentation

## 2019-01-19 DIAGNOSIS — R1011 Right upper quadrant pain: Secondary | ICD-10-CM | POA: Insufficient documentation

## 2019-01-19 DIAGNOSIS — T39395A Adverse effect of other nonsteroidal anti-inflammatory drugs [NSAID], initial encounter: Secondary | ICD-10-CM | POA: Insufficient documentation

## 2019-01-19 DIAGNOSIS — K2960 Other gastritis without bleeding: Secondary | ICD-10-CM | POA: Insufficient documentation

## 2019-01-19 LAB — COMPREHENSIVE METABOLIC PANEL
ALT: 21 U/L (ref 0–44)
AST: 28 U/L (ref 15–41)
Albumin: 5.6 g/dL — ABNORMAL HIGH (ref 3.5–5.0)
Alkaline Phosphatase: 58 U/L (ref 38–126)
Anion gap: 11 (ref 5–15)
BUN: 29 mg/dL — ABNORMAL HIGH (ref 6–20)
CO2: 24 mmol/L (ref 22–32)
Calcium: 10.3 mg/dL (ref 8.9–10.3)
Chloride: 105 mmol/L (ref 98–111)
Creatinine, Ser: 1 mg/dL (ref 0.44–1.00)
GFR calc Af Amer: >60 mL/min (ref >60)
GFR calc non Af Amer: >60 mL/min (ref >60)
Glucose, Bld: 115 mg/dL — ABNORMAL HIGH (ref 70–99)
Potassium: 2.9 mmol/L — ABNORMAL LOW (ref 3.5–5.1)
Sodium: 140 mmol/L (ref 135–145)
Total Bilirubin: 1.4 mg/dL — ABNORMAL HIGH (ref 0.3–1.2)
Total Protein: 9.3 g/dL — ABNORMAL HIGH (ref 6.5–8.1)

## 2019-01-19 LAB — URINALYSIS, ROUTINE W REFLEX MICROSCOPIC
Bacteria, UA: NONE SEEN
Bilirubin Urine: NEGATIVE
Glucose, UA: NEGATIVE mg/dL
Ketones, ur: 5 mg/dL — AB
Nitrite: NEGATIVE
Protein, ur: 100 mg/dL — AB
Specific Gravity, Urine: 1.034 — ABNORMAL HIGH (ref 1.005–1.030)
pH: 5 (ref 5.0–8.0)

## 2019-01-19 LAB — CBC WITH DIFFERENTIAL/PLATELET
Abs Immature Granulocytes: 0.05 10*3/uL (ref 0.00–0.07)
Basophils Absolute: 0 10*3/uL (ref 0.0–0.1)
Basophils Relative: 0 %
Eosinophils Absolute: 0 10*3/uL (ref 0.0–0.5)
Eosinophils Relative: 0 %
HCT: 42.5 % (ref 36.0–46.0)
Hemoglobin: 14.1 g/dL (ref 12.0–15.0)
Immature Granulocytes: 1 %
Lymphocytes Relative: 14 %
Lymphs Abs: 1.6 10*3/uL (ref 0.7–4.0)
MCH: 31.4 pg (ref 26.0–34.0)
MCHC: 33.2 g/dL (ref 30.0–36.0)
MCV: 94.7 fL (ref 80.0–100.0)
Monocytes Absolute: 0.6 10*3/uL (ref 0.1–1.0)
Monocytes Relative: 6 %
Neutro Abs: 8.7 10*3/uL — ABNORMAL HIGH (ref 1.7–7.7)
Neutrophils Relative %: 79 %
Platelets: 312 10*3/uL (ref 150–400)
RBC: 4.49 MIL/uL (ref 3.87–5.11)
RDW: 12.6 % (ref 11.5–15.5)
WBC: 10.9 10*3/uL — ABNORMAL HIGH (ref 4.0–10.5)
nRBC: 0 % (ref 0.0–0.2)

## 2019-01-19 LAB — LIPASE, BLOOD: Lipase: 21 U/L (ref 11–51)

## 2019-01-19 LAB — I-STAT BETA HCG BLOOD, ED (MC, WL, AP ONLY): I-stat hCG, quantitative: <5 m[IU]/mL (ref <5)

## 2019-01-19 LAB — I-STAT TROPONIN, ED: Troponin i, poc: 0 ng/mL (ref 0.00–0.08)

## 2019-01-19 MED ORDER — LIDOCAINE VISCOUS HCL 2 % MT SOLN
15.0000 mL | Freq: Once | OROMUCOSAL | Status: AC
  Administered 2019-01-19: 15 mL via ORAL
  Filled 2019-01-19: qty 15

## 2019-01-19 MED ORDER — ALUM & MAG HYDROXIDE-SIMETH 200-200-20 MG/5ML PO SUSP
30.0000 mL | Freq: Once | ORAL | Status: AC
  Administered 2019-01-19: 30 mL via ORAL
  Filled 2019-01-19: qty 30

## 2019-01-19 MED ORDER — METOCLOPRAMIDE HCL 5 MG/ML IJ SOLN
10.0000 mg | Freq: Once | INTRAMUSCULAR | Status: AC
  Administered 2019-01-19: 10 mg via INTRAVENOUS
  Filled 2019-01-19: qty 2

## 2019-01-19 MED ORDER — SODIUM CHLORIDE 0.9 % IV BOLUS
2000.0000 mL | Freq: Once | INTRAVENOUS | Status: AC
  Administered 2019-01-19: 2000 mL via INTRAVENOUS

## 2019-01-19 MED ORDER — PANTOPRAZOLE SODIUM 40 MG IV SOLR
40.0000 mg | Freq: Once | INTRAVENOUS | Status: AC
  Administered 2019-01-19: 40 mg via INTRAVENOUS
  Filled 2019-01-19: qty 40

## 2019-01-19 MED ORDER — POTASSIUM CHLORIDE CRYS ER 20 MEQ PO TBCR
40.0000 meq | EXTENDED_RELEASE_TABLET | Freq: Once | ORAL | Status: AC
  Administered 2019-01-19: 40 meq via ORAL
  Filled 2019-01-19: qty 2

## 2019-01-19 MED ORDER — METOCLOPRAMIDE HCL 10 MG PO TABS: 10.0000 mg | ORAL_TABLET | Freq: Four times a day (QID) | ORAL | 0 refills | Status: DC | PRN

## 2019-01-19 MED ORDER — MORPHINE SULFATE (PF) 4 MG/ML IV SOLN
4.0000 mg | Freq: Once | INTRAVENOUS | Status: AC
  Administered 2019-01-19: 4 mg via INTRAVENOUS
  Filled 2019-01-19: qty 1

## 2019-01-19 MED ORDER — POTASSIUM CHLORIDE CRYS ER 20 MEQ PO TBCR: 20.0000 meq | EXTENDED_RELEASE_TABLET | Freq: Every day | ORAL | 0 refills | Status: DC

## 2019-01-19 MED ORDER — PANTOPRAZOLE SODIUM 20 MG PO TBEC: 20.0000 mg | DELAYED_RELEASE_TABLET | Freq: Every day | ORAL | 0 refills | Status: DC

## 2019-01-19 NOTE — ED Triage Notes (Addendum)
Pt arrives via [facility] from home. Per [facility]: pt reports generalized abd pain for 3 days.  Pt dx with ovarian cysts. Pt reports nausea. Pt has hx of marijuana use

## 2019-01-19 NOTE — Discharge Instructions (Signed)
Avoid all NSAIDs including naproxen.  You may use Tylenol as needed for cramps or if you must take naproxen, take a lower dose with food.  You may take over-the-counter Mylanta and Maalox in addition to prescribed medications.

## 2019-01-19 NOTE — ED Provider Notes (Signed)
[facility]-EMERGENCY DEPT Provider Note   CSN: [id number] Arrival date & time: 01/19/19  1729    History   Chief Complaint Chief Complaint  Patient presents with  . Abdominal Pain    HPI [name] is a 28 y.o. female.     HPI Patient presents with upper abdominal pain which started yesterday morning.  She is had multiple episodes of vomiting is been unable to tolerate liquid or solid intake.  She also reports one loose stool.  She is had subjective fevers and chills.  States the pain is constant and does not radiate.  She is recently been on her menstrual cycle and states the pain is different than her menstrual cramps.  Believes she has 1 more day left of the cycle.  Denies any urinary symptoms.  No obvious blood in the vomit or stool. Past Medical History:  Diagnosis Date  . Medical history non-contributory   . Pneumothorax     Patient Active Problem List   Diagnosis Date Noted  . Dysmenorrhea 07/01/2013  . Nausea & vomiting 09/22/2011  . 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 NO PAST SURGERIES       OB History    Gravida  0   Para      Term      Preterm      AB      Living        SAB      TAB      Ectopic      Multiple      Live Births               Home Medications    Prior to Admission medications   Medication Sig Start Date End Date Taking? Authorizing Provider  naproxen sodium (ANAPROX) 220 MG tablet Take 440 mg by mouth every 8 (eight) hours as needed (for pain.).    Yes [provider]  metoCLOPramide (REGLAN) 10 MG tablet Take 1 tablet (10 mg total) by mouth every 6 (six) hours as needed for nausea. 01/19/19   Loren Racer, MD  ondansetron (ZOFRAN ODT) 4 MG disintegrating tablet Take 1 tablet (4 mg total) by mouth every 8 (eight) hours as needed for nausea or vomiting. Patient not taking: Reported  on 01/19/2019 11/29/16   Melene Plan, DO  ondansetron (ZOFRAN) 4 MG tablet Take 1 tablet (4 mg total) by mouth every 6 (six) hours. Patient not taking: Reported on 11/29/2016 03/11/16   Audry Pili, PA-C  pantoprazole (PROTONIX) 20 MG tablet Take 1 tablet (20 mg total) by mouth daily. 01/19/19   Loren Racer, MD  potassium chloride SA (K-DUR,KLOR-CON) 20 MEQ tablet Take 1 tablet (20 mEq total) by mouth daily. 01/20/19   Loren Racer, MD  promethazine (PHENERGAN) 25 MG tablet Take 1 tablet (25 mg total) by mouth every 6 (six) hours as needed for nausea or vomiting. Patient not taking: Reported on 01/19/2019 06/01/18   Muthersbaugh, Dahlia Client, PA-C    Family History Family History  Problem Relation Age of Onset  . Hypertension Mother     Social History Social History   Tobacco Use  . Smoking status: Never Smoker  . Smokeless tobacco: Never Used  Substance Use Topics  . Alcohol use: No  . Drug use: Yes    Types: Marijuana     Allergies   Vancomycin   Review of Systems Review  of Systems  Constitutional: Positive for fatigue. Negative for chills and fever.  HENT: Negative for trouble swallowing.   Respiratory: Negative for cough and shortness of breath.   Cardiovascular: Negative for chest pain.  Gastrointestinal: Positive for diarrhea, nausea and vomiting.  Genitourinary: Positive for vaginal bleeding. Negative for dysuria, flank pain, frequency, pelvic pain and vaginal discharge.  Musculoskeletal: Negative for back pain, myalgias and neck pain.  Skin: Negative for rash and wound.  Neurological: Negative for dizziness, weakness, light-headedness, numbness and headaches.  All other systems reviewed and are negative.    Physical Exam Updated Vital Signs BP 103/64   Pulse 64   Temp 98.7 F (37.1 C) (Oral)   Resp 16   Wt 49.9 kg   SpO2 100%   BMI 21.48 kg/m   Physical Exam Vitals signs and nursing note reviewed.  Constitutional:      Appearance: Normal appearance. She is  well-developed.  HENT:     Head: Normocephalic and atraumatic.     Nose: Nose normal.     Mouth/Throat:     Mouth: Mucous membranes are moist.  Eyes:     Pupils: Pupils are equal, round, and reactive to light.  Neck:     Musculoskeletal: Normal range of motion and neck supple. No neck rigidity or muscular tenderness.  Cardiovascular:     Rate and Rhythm: Normal rate and regular rhythm.     Heart sounds: No murmur. No friction rub. No gallop.   Pulmonary:     Effort: Pulmonary effort is normal. No respiratory distress.     Breath sounds: Normal breath sounds. No stridor. No wheezing, rhonchi or rales.  Chest:     Chest wall: No tenderness.  Abdominal:     General: Bowel sounds are normal.     Palpations: Abdomen is soft.     Tenderness: There is abdominal tenderness. There is no guarding or rebound.     Comments: Diffuse upper abdominal tenderness to palpation which appears most concentrated in the right upper and epigastric areas.  No rebound or guarding.  Musculoskeletal: Normal range of motion.        General: No tenderness.     Comments: No CVA tenderness bilaterally.  No lower extremity swelling, asymmetry or tenderness.  Lymphadenopathy:     Cervical: No cervical adenopathy.  Skin:    General: Skin is warm and dry.     Capillary Refill: Capillary refill takes less than 2 seconds.     Findings: No erythema or rash.  Neurological:     General: No focal deficit present.     Mental Status: She is alert and oriented to person, place, and time.  Psychiatric:        Behavior: Behavior normal.      ED Treatments / Results  Labs (all labs ordered are listed, but only abnormal results are displayed) Labs Reviewed  CBC WITH DIFFERENTIAL/PLATELET - Abnormal; Notable for the following components:      Result Value   WBC 10.9 (*)    Neutro Abs 8.7 (*)    All other components within normal limits  COMPREHENSIVE METABOLIC PANEL - Abnormal; Notable for the following components:    Potassium 2.9 (*)    Glucose, Bld 115 (*)    BUN 29 (*)    Total Protein 9.3 (*)    Albumin 5.6 (*)    Total Bilirubin 1.4 (*)    All other components within normal limits  URINALYSIS, ROUTINE W REFLEX MICROSCOPIC - Abnormal; Notable  for the following components:   Color, Urine AMBER (*)    APPearance HAZY (*)    Specific Gravity, Urine 1.034 (*)    Hgb urine dipstick LARGE (*)    Ketones, ur 5 (*)    Protein, ur 100 (*)    Leukocytes,Ua TRACE (*)    All other components within normal limits  LIPASE, BLOOD  I-STAT BETA HCG BLOOD, ED ([facility], [facility], [facility] ONLY)  I-STAT TROPONIN, ED    EKG None  Radiology US Abdomen Limited  Result Date: 01/19/2019 CLINICAL DATA:  Right upper quadrant pain with vomiting. EXAM: ULTRASOUND ABDOMEN LIMITED RIGHT UPPER QUADRANT COMPARISON:  None. FINDINGS: Gallbladder: No gallstones or wall thickening visualized. No sonographic Murphy sign noted by sonographer. Common bile duct: Diameter: 2.8 mm. Liver: No focal lesion identified. Within normal limits in parenchymal echogenicity. Portal vein is patent on color Doppler imaging with normal direction of blood flow towards the liver. IMPRESSION: Normal right upper quadrant ultrasound. Electronically Signed   By: Elberta Fortis M.D.   On: 01/19/2019 19:10    Procedures Procedures (including critical care time)  Medications Ordered in ED Medications  sodium chloride 0.9 % bolus 2,000 mL (0 mLs Intravenous Stopped 01/19/19 1951)  metoCLOPramide (REGLAN) injection 10 mg (10 mg Intravenous Given 01/19/19 1841)  morphine 4 MG/ML injection 4 mg (4 mg Intravenous Given 01/19/19 1842)  pantoprazole (PROTONIX) injection 40 mg (40 mg Intravenous Given 01/19/19 1946)  alum & mag hydroxide-simeth (MAALOX/MYLANTA) 200-200-20 MG/5ML suspension 30 mL (30 mLs Oral Given 01/19/19 1948)    And  lidocaine (XYLOCAINE) 2 % viscous mouth solution 15 mL (15 mLs Oral Given 01/19/19 1948)  potassium chloride SA (K-DUR,KLOR-CON) CR tablet 40 mEq (40  mEq Oral Given 01/19/19 2035)     Initial Impression / Assessment and Plan / ED Course  I have reviewed the triage vital signs and the nursing notes.  Pertinent labs & imaging results that were available during my care of the patient were reviewed by me and considered in my medical decision making (see chart for details).        Patient states she is feeling much better.  Tolerating fluids.  Vital signs remained stable.  Upper abdominal ultrasound without acute findings.  Patient states she is been taking naproxen multiple times daily to help control cramping symptoms.  Suspect she is developing NSAID induced gastritis.  She is advised to avoid NSAIDs and use Tylenol as needed for cramps.  Will initiate oral potassium replacement start on PPI.  Advised to follow-up with gastroneurology should symptoms persist.  Return precautions given.  Final Clinical Impressions(s) / ED Diagnoses   Final diagnoses:  NSAID induced gastritis  Hypokalemia    ED Discharge Orders         Ordered    potassium chloride SA (K-DUR,KLOR-CON) 20 MEQ tablet  Daily     01/19/19 2045    pantoprazole (PROTONIX) 20 MG tablet  Daily     01/19/19 2045    metoCLOPramide (REGLAN) 10 MG tablet  Every 6 hours PRN     01/19/19 2045           Loren Racer, MD 01/19/19 2047

## 2019-01-19 NOTE — ED Notes (Signed)
Pt unable to urinate at this time.   Will try again later.

## 2019-04-21 ENCOUNTER — Other Ambulatory Visit: Payer: Self-pay

## 2019-04-21 ENCOUNTER — Encounter (HOSPITAL_COMMUNITY): Payer: Self-pay | Admitting: *Deleted

## 2019-04-21 ENCOUNTER — Ambulatory Visit (HOSPITAL_COMMUNITY)
Admission: EM | Admit: 2019-04-21 | Discharge: 2019-04-21 | Disposition: A | Discharge status: Home / Self Care | Payer: Medicaid Other

## 2019-04-21 DIAGNOSIS — R197 Diarrhea, unspecified: Secondary | ICD-10-CM

## 2019-04-21 DIAGNOSIS — K521 Toxic gastroenteritis and colitis: Secondary | ICD-10-CM

## 2019-04-21 DIAGNOSIS — T3695XA Adverse effect of unspecified systemic antibiotic, initial encounter: Secondary | ICD-10-CM

## 2019-04-21 DIAGNOSIS — R112 Nausea with vomiting, unspecified: Secondary | ICD-10-CM

## 2019-04-21 MED ORDER — LOPERAMIDE HCL 2 MG PO CAPS: 2.0000 mg | ORAL_CAPSULE | ORAL | 0 refills | Status: DC | PRN

## 2019-04-21 MED ORDER — ONDANSETRON 8 MG PO TBDP: 8.0000 mg | ORAL_TABLET | Freq: Three times a day (TID) | ORAL | 0 refills | Status: DC | PRN

## 2019-04-21 MED ORDER — ONDANSETRON 4 MG PO TBDP
ORAL_TABLET | ORAL | Status: AC
  Filled 2019-04-21: qty 1

## 2019-04-21 MED ORDER — ONDANSETRON 4 MG PO TBDP
4.0000 mg | ORAL_TABLET | Freq: Once | ORAL | Status: AC
  Administered 2019-04-21: 4 mg via ORAL

## 2019-04-21 NOTE — Discharge Instructions (Addendum)
For diarrhea, take loperamide (Imodium) 2mg  capsule twice today. Then tomorrow, if you still have trouble with diarrhea, take loperamide once a day until you stop having diarrhea.

## 2019-04-21 NOTE — ED Triage Notes (Signed)
Reports vomiting and diarrhea x 2 days.  Denies fever, chills.  Only c/o abd "soreness and cramping from vomiting".

## 2019-04-21 NOTE — ED Triage Notes (Signed)
Pt had dental procedure done 5 days ago; has not been able to keep down abx.

## 2019-04-21 NOTE — ED Provider Notes (Signed)
MRN: [id number] DOB: 1991-03-14  Subjective:   [name] is a 28 y.o. female presenting for 2-day history of persistent nausea with vomiting and diarrhea.  Patient had tooth extraction 5 days ago and was started on Keflex and amoxicillin which she is still taking.  She has very difficult time with these antibiotics.  Denies abdominal pain but does feel cramping and soreness in her stomach when she vomits and has bouts of diarrhea.  Denies bloody stools, fever.  No current facility-administered medications for this encounter.   Current Outpatient Medications:  .  Acetaminophen-Codeine 300-30 MG tablet, Take 1 tablet by mouth every 4 (four) hours as needed for pain., Disp: , Rfl:  .  amoxicillin (AMOXIL) 500 MG tablet, Take 500 mg by mouth 3 (three) times daily., Disp: , Rfl:  .  cephALEXin (KEFLEX) 500 MG capsule, Take 500 mg by mouth 4 (four) times daily., Disp: , Rfl:  .  HYDROcodone-acetaminophen (NORCO) 7.5-325 MG tablet, Take 1 tablet by mouth every 6 (six) hours as needed for moderate pain., Disp: , Rfl:    Allergies  Allergen Reactions  . Vancomycin Itching    Past Medical History:  Diagnosis Date  . Pneumothorax      Past Surgical History:  Procedure Laterality Date  . NO PAST SURGERIES      ROS  Objective:   Vitals: BP 121/72   Pulse 64   Temp 98 F (36.7 C) (Temporal)   Resp 16   LMP 04/08/2019 (Exact Date)   SpO2 100%   Physical Exam Constitutional:      General: She is not in acute distress.    Appearance: Normal appearance. She is well-developed and normal weight. She is not ill-appearing, toxic-appearing or diaphoretic.  HENT:     Head: Normocephalic and atraumatic.     Right Ear: External ear normal.     Left Ear: External ear normal.     Nose: Nose normal.     Mouth/Throat:     Mouth: Mucous membranes are moist.     Pharynx: Oropharynx is clear.  Eyes:     General: No scleral icterus.    Extraocular Movements: Extraocular movements  intact.     Pupils: Pupils are equal, round, and reactive to light.  Cardiovascular:     Rate and Rhythm: Normal rate and regular rhythm.     Heart sounds: Normal heart sounds. No murmur. No friction rub. No gallop.   Pulmonary:     Effort: Pulmonary effort is normal. No respiratory distress.     Breath sounds: Normal breath sounds. No stridor. No wheezing, rhonchi or rales.  Abdominal:     General: Bowel sounds are normal. There is no distension.     Palpations: Abdomen is soft. There is no mass.     Tenderness: There is no abdominal tenderness. There is no right CVA tenderness, left CVA tenderness, guarding or rebound.  Skin:    General: Skin is warm and dry.     Coloration: Skin is not pale.     Findings: No rash.  Neurological:     General: No focal deficit present.     Mental Status: She is alert and oriented to person, place, and time.  Psychiatric:        Mood and Affect: Mood normal.        Behavior: Behavior normal.        Thought Content: Thought content normal.        Judgment: Judgment normal.  Assessment and Plan :   1. Antibiotic-associated diarrhea   2. Nausea vomiting and diarrhea    We will manage for antibiotic associated diarrhea supportively with Zofran and Imodium.  Recommended patient hydrate aggressively, rest and use overall supportive care. She is to contact her dentist to request different course of antibiotic that would be less problematic for patient. Counseled patient on potential for adverse effects with medications prescribed/recommended today, ER and return-to-clinic precautions discussed, patient verbalized understanding.    Wallis BambergMani, Lenetta Piche, PA-C 04/21/19 1524

## 2019-04-22 ENCOUNTER — Emergency Department (HOSPITAL_COMMUNITY)
Admission: EM | Admit: 2019-04-22 | Discharge: 2019-04-22 | Disposition: A | Discharge status: Home / Self Care | Payer: Medicaid Other | Attending: Emergency Medicine | Admitting: Emergency Medicine

## 2019-04-22 ENCOUNTER — Other Ambulatory Visit: Payer: Self-pay

## 2019-04-22 DIAGNOSIS — R197 Diarrhea, unspecified: Secondary | ICD-10-CM | POA: Insufficient documentation

## 2019-04-22 DIAGNOSIS — Z79899 Other long term (current) drug therapy: Secondary | ICD-10-CM | POA: Insufficient documentation

## 2019-04-22 DIAGNOSIS — R1115 Cyclical vomiting syndrome unrelated to migraine: Secondary | ICD-10-CM | POA: Insufficient documentation

## 2019-04-22 LAB — COMPREHENSIVE METABOLIC PANEL
ALT: 16 U/L (ref 0–44)
AST: 22 U/L (ref 15–41)
Albumin: 4.2 g/dL (ref 3.5–5.0)
Alkaline Phosphatase: 47 U/L (ref 38–126)
Anion gap: 11 (ref 5–15)
BUN: 20 mg/dL (ref 6–20)
CO2: 29 mmol/L (ref 22–32)
Calcium: 10 mg/dL (ref 8.9–10.3)
Chloride: 100 mmol/L (ref 98–111)
Creatinine, Ser: 0.8 mg/dL (ref 0.44–1.00)
GFR calc Af Amer: >60 mL/min (ref >60)
GFR calc non Af Amer: >60 mL/min (ref >60)
Glucose, Bld: 115 mg/dL — ABNORMAL HIGH (ref 70–99)
Potassium: 3 mmol/L — ABNORMAL LOW (ref 3.5–5.1)
Sodium: 140 mmol/L (ref 135–145)
Total Bilirubin: 0.9 mg/dL (ref 0.3–1.2)
Total Protein: 7.7 g/dL (ref 6.5–8.1)

## 2019-04-22 LAB — CBC
HCT: 37.3 % (ref 36.0–46.0)
Hemoglobin: 12.7 g/dL (ref 12.0–15.0)
MCH: 31.6 pg (ref 26.0–34.0)
MCHC: 34 g/dL (ref 30.0–36.0)
MCV: 92.8 fL (ref 80.0–100.0)
Platelets: 344 10*3/uL (ref 150–400)
RBC: 4.02 MIL/uL (ref 3.87–5.11)
RDW: 12.6 % (ref 11.5–15.5)
WBC: 11.4 10*3/uL — ABNORMAL HIGH (ref 4.0–10.5)
nRBC: 0 % (ref 0.0–0.2)

## 2019-04-22 LAB — I-STAT BETA HCG BLOOD, ED (MC, WL, AP ONLY): I-stat hCG, quantitative: <5 m[IU]/mL (ref <5)

## 2019-04-22 LAB — URINALYSIS, ROUTINE W REFLEX MICROSCOPIC
Bilirubin Urine: NEGATIVE
Glucose, UA: NEGATIVE mg/dL
Ketones, ur: NEGATIVE mg/dL
Nitrite: NEGATIVE
Protein, ur: 100 mg/dL — AB
Specific Gravity, Urine: 1.034 — ABNORMAL HIGH (ref 1.005–1.030)
pH: 5 (ref 5.0–8.0)

## 2019-04-22 LAB — RAPID URINE DRUG SCREEN, HOSP PERFORMED
Amphetamines: NOT DETECTED
Barbiturates: NOT DETECTED
Benzodiazepines: NOT DETECTED
Cocaine: NOT DETECTED
Opiates: POSITIVE — AB
Tetrahydrocannabinol: POSITIVE — AB

## 2019-04-22 LAB — LIPASE, BLOOD: Lipase: 24 U/L (ref 11–51)

## 2019-04-22 MED ORDER — PROMETHAZINE HCL 25 MG/ML IJ SOLN
12.5000 mg | Freq: Once | INTRAMUSCULAR | Status: AC
  Administered 2019-04-22: 12.5 mg via INTRAMUSCULAR
  Filled 2019-04-22: qty 1

## 2019-04-22 MED ORDER — PROMETHAZINE HCL 25 MG PO TABS: 25.0000 mg | ORAL_TABLET | Freq: Four times a day (QID) | ORAL | 0 refills | Status: DC | PRN

## 2019-04-22 MED ORDER — SODIUM CHLORIDE 0.9% FLUSH
3.0000 mL | Freq: Once | INTRAVENOUS | Status: AC
  Administered 2019-04-22: 3 mL via INTRAVENOUS

## 2019-04-22 MED ORDER — SODIUM CHLORIDE 0.9 % IV BOLUS
1500.0000 mL | Freq: Once | INTRAVENOUS | Status: AC
  Administered 2019-04-22: 1500 mL via INTRAVENOUS

## 2019-04-22 NOTE — ED Notes (Signed)
Discharge instructions discussed with pt. Pt verbalized understanding. Pt stable and ambulatory. No signature pad available. 

## 2019-04-22 NOTE — ED Provider Notes (Signed)
[facility]   CSN: [id number] Arrival date & time: 04/22/19  1514     History   Chief Complaint Chief Complaint  Patient presents with  . Emesis    HPI [name] is a 28 y.o. female.  Who presents emergency department with chief complaint of nausea vomiting and diarrhea.Review of EMR patient has a history of multiple visits for the same.  She relates this to being associated with her menstrual cycle.  She states that it usually only happens then.  Patient is not currently menstruating.  She began having symptoms of nausea and vomiting beginning 3 days ago.  Since that time she has had 6-8 episodes of vomiting daily throughout the day and night without relief of symptoms.  She was seen at the urgent care yesterday and given Zofran but states that it did not control her vomiting.  Patient recently completed a course of amoxicillin which she took for a dental abscess prior to having tooth extractions on the third of this month.  She has taken amoxicillin in the past without the symptoms.  Patient reports that yesterday she noticed her vomitus was dark as well as her stools which she says are loose but not watery.  She is not taking Pepto-Bismol or iron.  She is a history of NSAID induced gastritis.  She denies regular abdominal pain but does complain of some crampy epigastric pain at this time.  She denies any fevers or chills.     HPI  Past Medical History:  Diagnosis Date  . Pneumothorax     Patient Active Problem List   Diagnosis Date Noted  . Dysmenorrhea 07/01/2013  . Nausea & vomiting 09/22/2011  . 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 NO PAST SURGERIES       OB History    Gravida  0   Para      Term      Preterm      AB      Living        SAB      TAB      Ectopic      Multiple      Live Births              Home Medications    Prior to Admission medications   Medication Sig Start Date End Date Taking? Authorizing Provider  Acetaminophen-Codeine 300-30 MG tablet Take 1 tablet by mouth every 4 (four) hours as needed for pain.    [provider]  amoxicillin (AMOXIL) 500 MG tablet Take 500 mg by mouth 3 (three) times daily.    [provider]  cephALEXin (KEFLEX) 500 MG capsule Take 500 mg by mouth 4 (four) times daily.    [provider]  HYDROcodone-acetaminophen (NORCO) 7.5-325 MG tablet Take 1 tablet by mouth every 6 (six) hours as needed for moderate pain.    [provider]  loperamide (IMODIUM) 2 MG capsule Take 1 capsule (2 mg total) by mouth as needed for diarrhea or loose stools. 04/21/19   Wallis BambergMani, Mario, PA-C  ondansetron (ZOFRAN-ODT) 8 MG disintegrating tablet Take 1 tablet (8 mg total) by mouth every 8 (eight) hours as needed for nausea or vomiting. 04/21/19   Wallis BambergMani, Mario, PA-C  metoCLOPramide (REGLAN) 10 MG tablet Take 1 tablet (10 mg total) by mouth every 6 (six) hours as needed for  nausea. 01/19/19 04/21/19  Julianne Rice, MD  pantoprazole (PROTONIX) 20 MG tablet Take 1 tablet (20 mg total) by mouth daily. 01/19/19 04/21/19  Julianne Rice, MD  potassium chloride SA (K-DUR,KLOR-CON) 20 MEQ tablet Take 1 tablet (20 mEq total) by mouth daily. 01/20/19 04/21/19  Julianne Rice, MD  promethazine (PHENERGAN) 25 MG tablet Take 1 tablet (25 mg total) by mouth every 6 (six) hours as needed for nausea or vomiting. Patient not taking: Reported on 01/19/2019 06/01/18 04/21/19  Muthersbaugh, Jarrett Soho, PA-C    Family History Family History  Problem Relation Age of Onset  . Hypertension Mother   . Healthy Father     Social History Social History   Tobacco Use  . Smoking status: Never Smoker  . Smokeless tobacco: Never Used  Substance Use Topics  . Alcohol use: No  . Drug use: Yes    Types: Marijuana     Allergies   Vancomycin   Review of  Systems Review of Systems Ten systems reviewed and are negative for acute change, except as noted in the HPI.    Physical Exam Updated Vital Signs BP 114/74   Pulse 73   Temp 98.9 F (37.2 C) (Oral)   Resp 17   LMP 04/08/2019 (Exact Date)   SpO2 99%   Physical Exam Vitals signs and nursing note reviewed.  Constitutional:      General: She is not in acute distress.    Appearance: She is well-developed. She is not diaphoretic.  HENT:     Head: Normocephalic and atraumatic.  Eyes:     General: No scleral icterus.    Conjunctiva/sclera: Conjunctivae normal.  Neck:     Musculoskeletal: Normal range of motion.  Cardiovascular:     Rate and Rhythm: Normal rate and regular rhythm.     Heart sounds: Normal heart sounds. No murmur. No friction rub. No gallop.   Pulmonary:     Effort: Pulmonary effort is normal. No respiratory distress.     Breath sounds: Normal breath sounds.  Abdominal:     General: Bowel sounds are normal. There is no distension.     Palpations: Abdomen is soft. There is no mass.     Tenderness: There is no abdominal tenderness. There is no guarding.  Skin:    General: Skin is warm and dry.  Neurological:     Mental Status: She is alert and oriented to person, place, and time.  Psychiatric:        Behavior: Behavior normal.      ED Treatments / Results  Labs (all labs ordered are listed, but only abnormal results are displayed) Labs Reviewed  CBC - Abnormal; Notable for the following components:      Result Value   WBC 11.4 (*)    All other components within normal limits  LIPASE, BLOOD  COMPREHENSIVE METABOLIC PANEL  URINALYSIS, ROUTINE W REFLEX MICROSCOPIC  I-STAT BETA HCG BLOOD, ED ([facility], [facility], [facility] ONLY)    EKG None  Radiology No results found.  Procedures Procedures (including critical care time)  Medications Ordered in ED Medications  sodium chloride flush (NS) 0.9 % injection 3 mL (has no administration in time range)  sodium chloride  0.9 % bolus 1,500 mL (has no administration in time range)  promethazine (PHENERGAN) injection 12.5 mg (has no administration in time range)     Initial Impression / Assessment and Plan / ED Course  I have reviewed the triage vital signs and the nursing notes.  Pertinent labs &  imaging results that were available during my care of the patient were reviewed by me and considered in my medical decision making (see chart for details).        ZO:[id number]:vomiting VS: BP (!) 143/79 (BP Location: Right Arm)   Pulse (!) 52   Temp 98.6 F (37 C) (Oral)   Resp 18   LMP 04/08/2019 (Exact Date)   SpO2 100%   UJ:[id number]:History is gathered by Patient and EMR. DDX:.The emergent differential diagnosis for vomiting includes, but is not limited to ACS/MI, Boerhaave's, DKA, Intracranial Hemorrhage, Ischemic bowel, Meningitis, Sepsis, Acute radiation syndrome, Acute gastric dilation, Acetaminophen toxicity, Adrenal insufficiency, Appendicitis, Aspirin toxicity, Bowel obstruction/ileus, Carbon monoxide poisoning, Cholecystitis, CNS tumor. Digoxin toxicity, Electrolyte abnormalities, Elevated ICP, Gastric outlet obstruction, Hyperemesis gravidarum, Pancreatitis, Peritonitis, Ruptured viscus, Testicular torsion/ovarian torsion, Theophyline toxicity, Biliary colic, Cannabinoid hyperemesis syndrome, Chemotherapy, Disulfiram effect, Erythromycin, ETOH, Gastritis, Gastroenteritis, Gastroparesis, Hepatitis, Ibuprofen, Ipecac toxicity, Labyrinthitis, Migraine, Motion sickness, Narcotic withdrawal, Thyroid, Pregnancy, Peptic ulcer disease, Renal colic, and UTI Labs: I reviewed the labs which show UDS positive for opiates and THC.  Pregnancy test negative.  Urine appears contaminated.  The patient is not complaining of suprapubic pain, flank pain, dysuria or urgency.CMP shows mild hypokalemia in the setting of vomiting Imaging: Not applicable EKG: na MDM: Patient here with vomiting.  She has a history of the same.  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  Patient has not had any vomiting here in the emergency department.  She is given Phenergan with improvement in her symptoms.  The patient appears appropriate for discharge at this time.  I discussed return precautions. Patient disposition: Discharge Patient condition: Good. The patient appears reasonably screened and/or stabilized for discharge and I doubt any other medical condition or other [facility] requiring further screening, evaluation, or treatment in the ED at this time prior to discharge. I have discussed lab and/or imaging findings with the patient and answered all questions/concerns to the best of my ability. I have discussed return precautions and OP follow up.      Final Clinical Impressions(s) / ED Diagnoses   Final diagnoses:  None    ED Discharge Orders    None       Arthor CaptainHarris, Kihanna Kamiya, PA-C 04/22/19 [name]    Knapp, Jon, MD 04/25/19 [phone]

## 2019-04-22 NOTE — ED Triage Notes (Signed)
Pt states for the last 6 days she has n/v/d which started after having her two wisdom teeth out. On same day pt also started pain medication and antibiotics.

## 2019-04-22 NOTE — Discharge Instructions (Signed)

## 2019-07-26 IMAGING — US ULTRASOUND ABDOMEN LIMITED
1 series · 14 of 25 positions shown · non-contrast
Comparison: None.

CLINICAL DATA: Right upper quadrant pain with vomiting.

EXAM:
ULTRASOUND ABDOMEN LIMITED RIGHT UPPER QUADRANT

[Series 1: ultrasound abdomen limited · 14 of 38 slices shown]
[im 1/38]
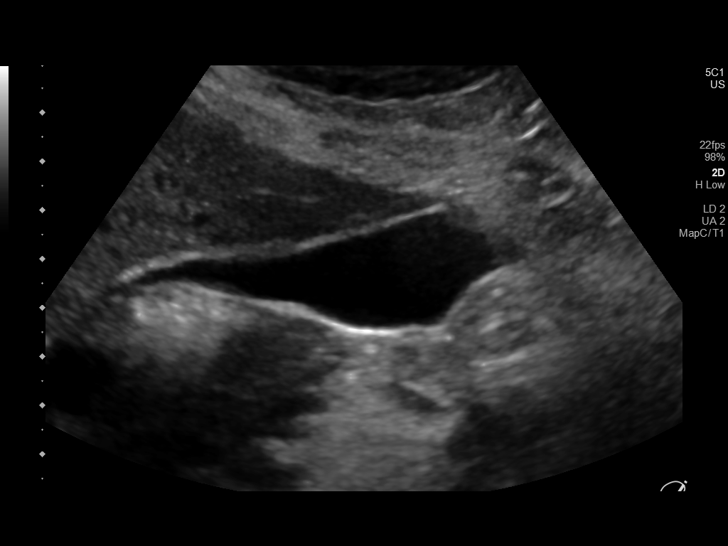
[im 4/38]
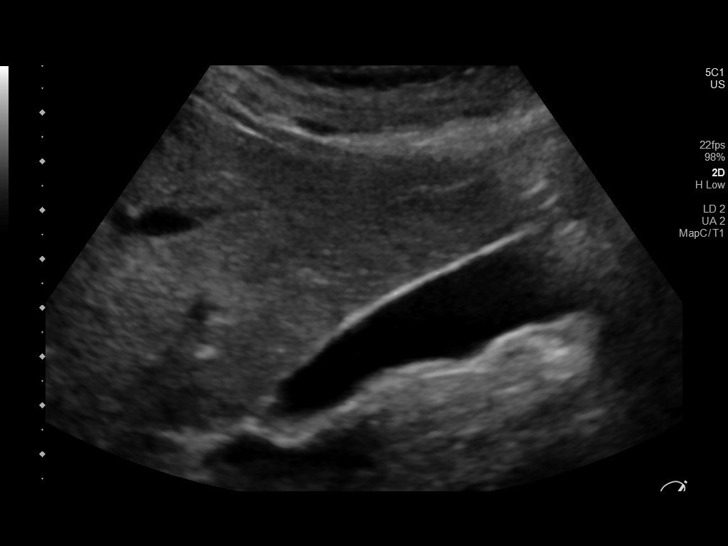
[im 7/38]
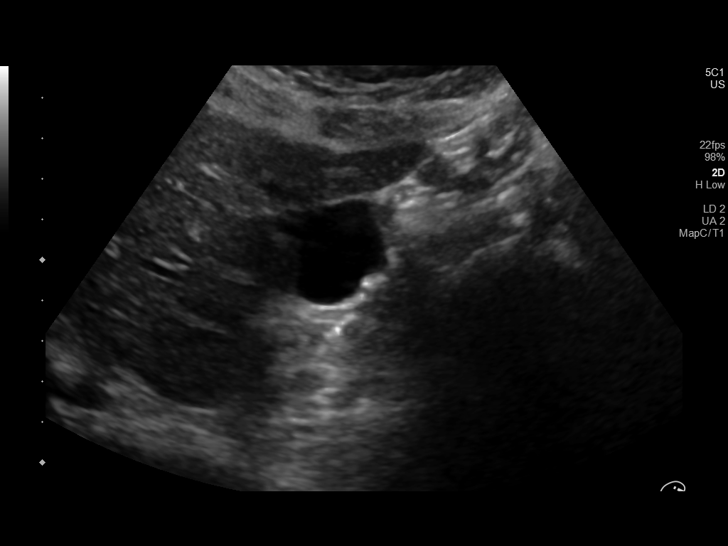
[im 10/38]
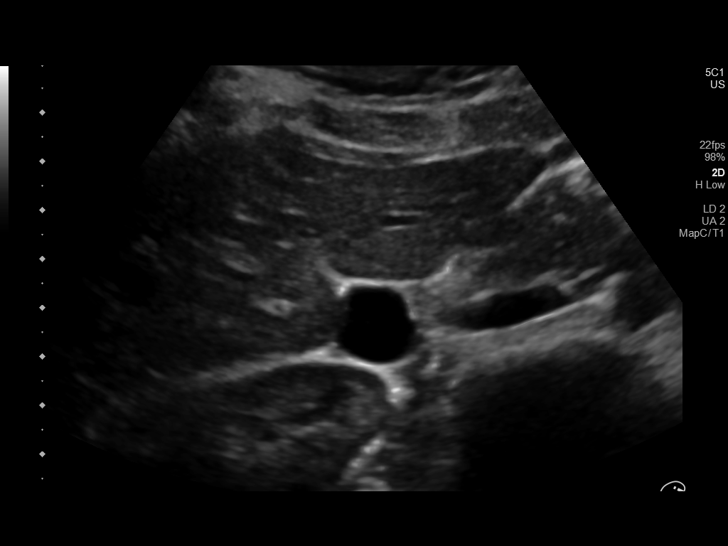
[im 13/38]
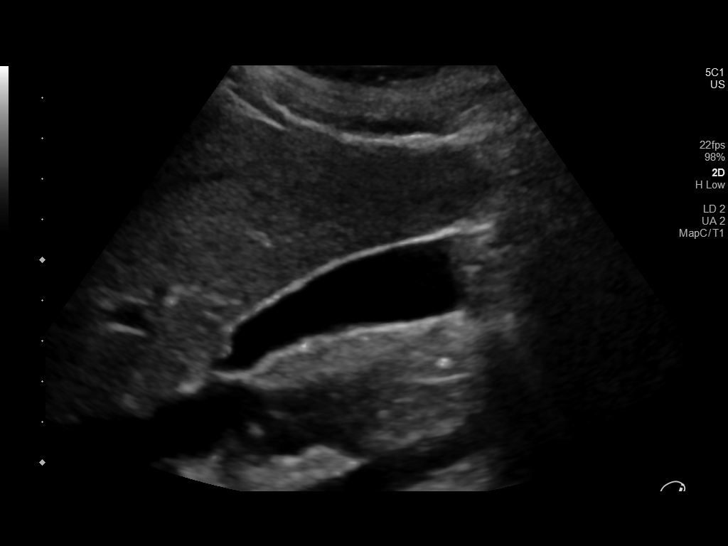
[im 14/38]
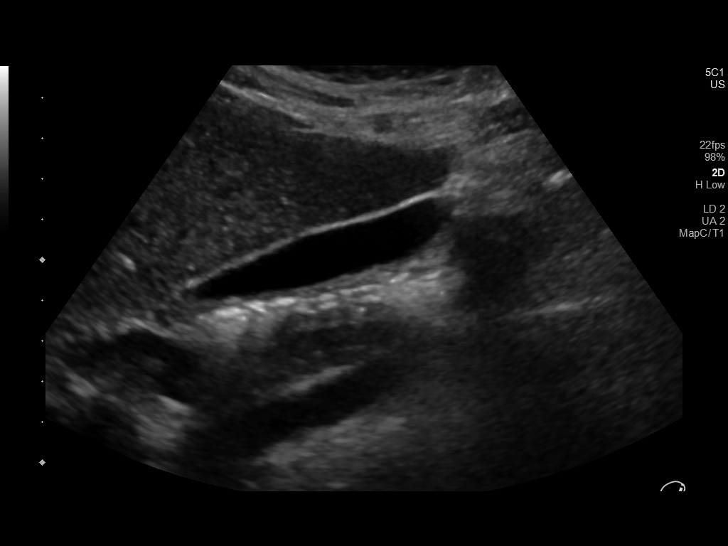
[im 17/38]
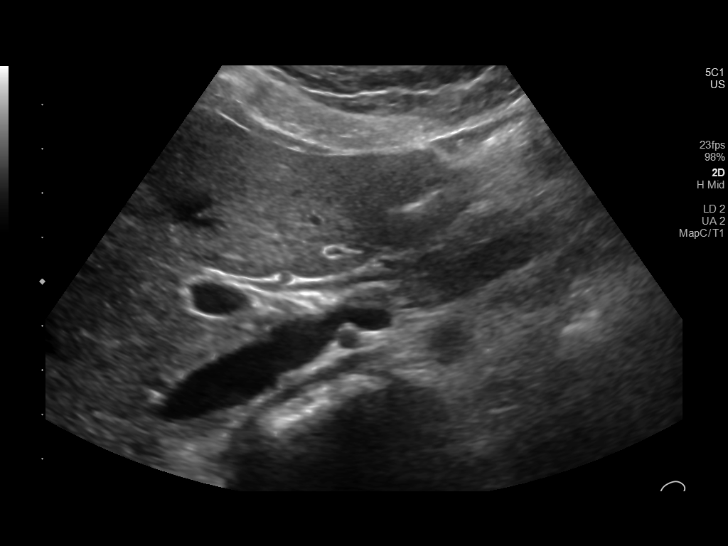
[im 21/38]
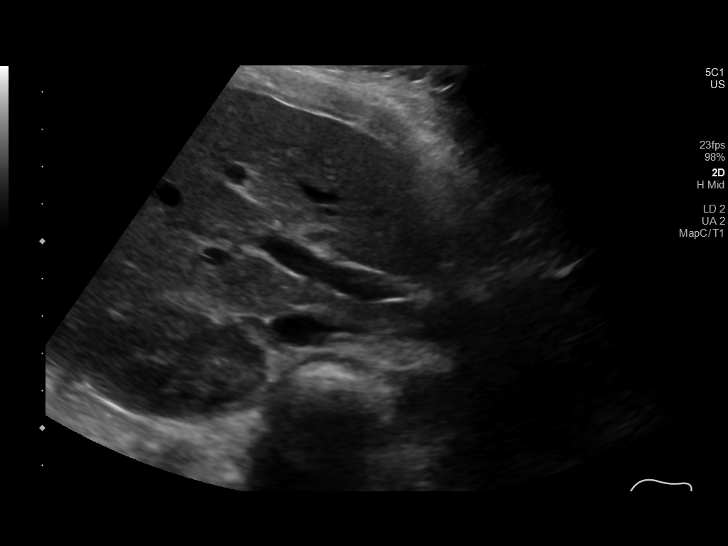
[im 24/38]
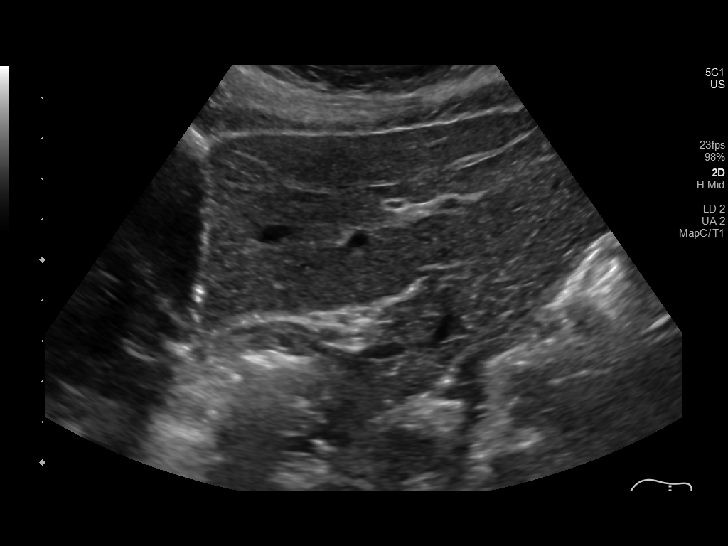
[im 25/38]
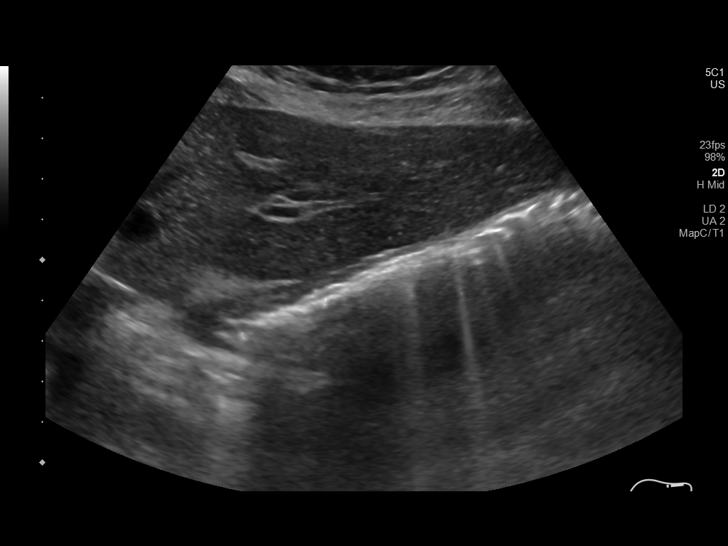
[im 28/38]
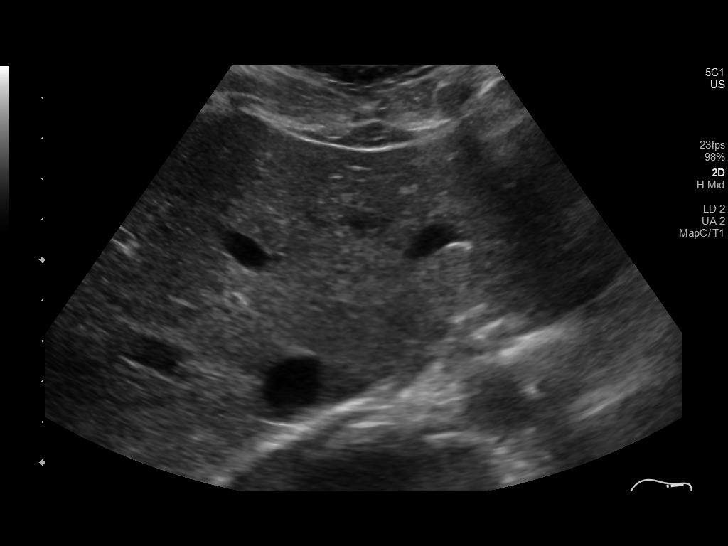
[im 31/38]
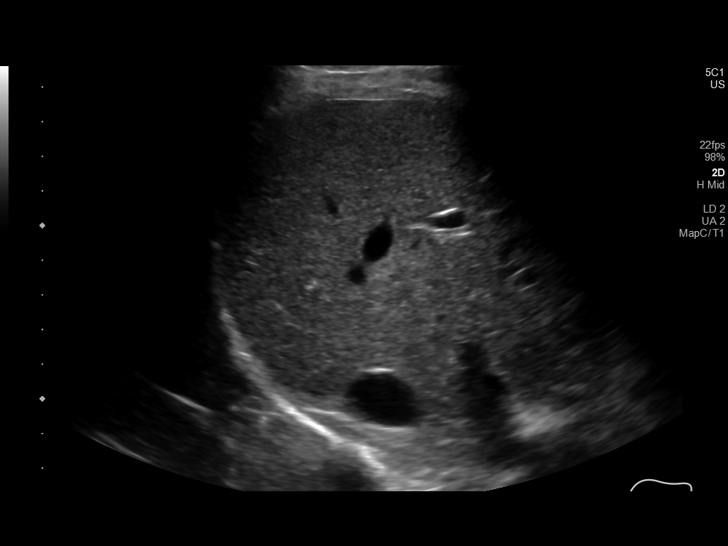
[im 34/38]
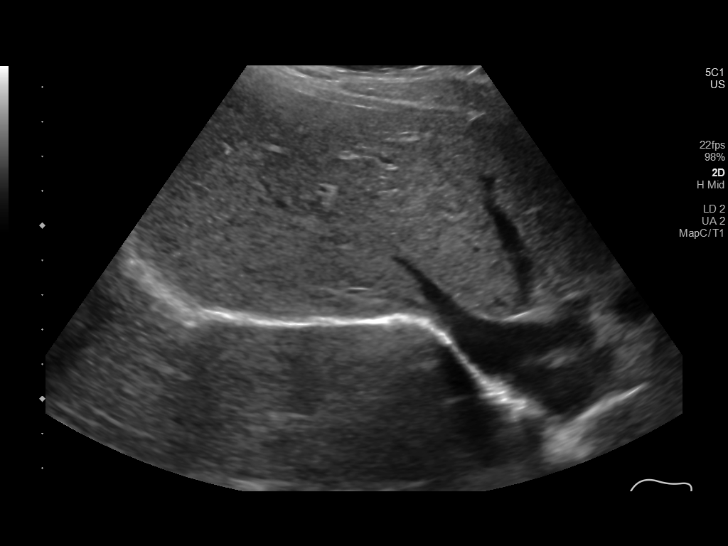
[im 38/38]
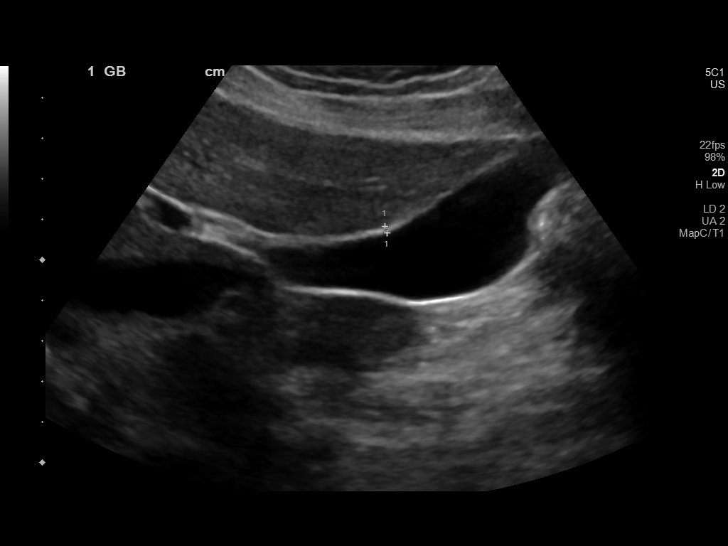

[14 of 25 positions shown; findings below may reference images not displayed]

FINDINGS: Gallbladder:

No gallstones or wall thickening visualized. No sonographic Murphy
sign noted by sonographer.

Common bile duct:

Diameter: 2.8 mm.

Liver:

No focal lesion identified. Within normal limits in parenchymal
echogenicity. Portal vein is patent on color Doppler imaging with
normal direction of blood flow towards the liver.
IMPRESSION: Normal right upper quadrant ultrasound.

## 2019-09-11 ENCOUNTER — Telehealth: Payer: Self-pay | Admitting: Family Medicine

## 2019-09-11 NOTE — Telephone Encounter (Signed)
Attempted to reach patient about changes made in the office. Was not able to leave a message on her phone.

## 2019-09-17 ENCOUNTER — Ambulatory Visit: Payer: Medicaid Other | Admitting: Obstetrics and Gynecology

## 2019-09-23 ENCOUNTER — Ambulatory Visit: Payer: Medicaid Other | Admitting: Family Medicine

## 2019-10-01 ENCOUNTER — Ambulatory Visit: Payer: Medicaid Other | Admitting: Student

## 2021-05-24 ENCOUNTER — Ambulatory Visit: Payer: Medicaid Other | Admitting: Student

## 2021-12-07 ENCOUNTER — Other Ambulatory Visit: Payer: Self-pay

## 2021-12-07 ENCOUNTER — Emergency Department (HOSPITAL_COMMUNITY)
Admission: EM | Admit: 2021-12-07 | Discharge: 2021-12-07 | Disposition: A | Discharge status: Home / Self Care | Payer: No Typology Code available for payment source | Attending: Emergency Medicine | Admitting: Emergency Medicine

## 2021-12-07 ENCOUNTER — Encounter (HOSPITAL_COMMUNITY): Payer: Self-pay | Admitting: Emergency Medicine

## 2021-12-07 DIAGNOSIS — R197 Diarrhea, unspecified: Secondary | ICD-10-CM | POA: Diagnosis not present

## 2021-12-07 DIAGNOSIS — R112 Nausea with vomiting, unspecified: Secondary | ICD-10-CM | POA: Diagnosis not present

## 2021-12-07 DIAGNOSIS — R1084 Generalized abdominal pain: Secondary | ICD-10-CM | POA: Insufficient documentation

## 2021-12-07 DIAGNOSIS — D72829 Elevated white blood cell count, unspecified: Secondary | ICD-10-CM | POA: Diagnosis not present

## 2021-12-07 LAB — COMPREHENSIVE METABOLIC PANEL
ALT: 27 U/L (ref 0–44)
AST: 47 U/L — ABNORMAL HIGH (ref 15–41)
Albumin: 5.1 g/dL — ABNORMAL HIGH (ref 3.5–5.0)
Alkaline Phosphatase: 49 U/L (ref 38–126)
Anion gap: 15 (ref 5–15)
BUN: 19 mg/dL (ref 6–20)
CO2: 20 mmol/L — ABNORMAL LOW (ref 22–32)
Calcium: 10.2 mg/dL (ref 8.9–10.3)
Chloride: 105 mmol/L (ref 98–111)
Creatinine, Ser: 0.96 mg/dL (ref 0.44–1.00)
GFR, Estimated: >60 mL/min (ref >60)
Glucose, Bld: 164 mg/dL — ABNORMAL HIGH (ref 70–99)
Potassium: 4.3 mmol/L (ref 3.5–5.1)
Sodium: 140 mmol/L (ref 135–145)
Total Bilirubin: 2.4 mg/dL — ABNORMAL HIGH (ref 0.3–1.2)
Total Protein: 8.2 g/dL — ABNORMAL HIGH (ref 6.5–8.1)

## 2021-12-07 LAB — URINALYSIS, ROUTINE W REFLEX MICROSCOPIC
Bilirubin Urine: NEGATIVE
Glucose, UA: NEGATIVE mg/dL
Hgb urine dipstick: NEGATIVE
Ketones, ur: 20 mg/dL — AB
Leukocytes,Ua: NEGATIVE
Nitrite: NEGATIVE
Protein, ur: 30 mg/dL — AB
Specific Gravity, Urine: 1.018 (ref 1.005–1.030)
pH: 8 (ref 5.0–8.0)

## 2021-12-07 LAB — RAPID URINE DRUG SCREEN, HOSP PERFORMED
Amphetamines: NOT DETECTED
Barbiturates: NOT DETECTED
Benzodiazepines: NOT DETECTED
Cocaine: NOT DETECTED
Opiates: NOT DETECTED
Tetrahydrocannabinol: POSITIVE — AB

## 2021-12-07 LAB — LIPASE, BLOOD: Lipase: 22 U/L (ref 11–51)

## 2021-12-07 LAB — CBC WITH DIFFERENTIAL/PLATELET
Abs Immature Granulocytes: 0.09 10*3/uL — ABNORMAL HIGH (ref 0.00–0.07)
Basophils Absolute: 0 10*3/uL (ref 0.0–0.1)
Basophils Relative: 0 %
Eosinophils Absolute: 0 10*3/uL (ref 0.0–0.5)
Eosinophils Relative: 0 %
HCT: 42 % (ref 36.0–46.0)
Hemoglobin: 14.5 g/dL (ref 12.0–15.0)
Immature Granulocytes: 1 %
Lymphocytes Relative: 4 %
Lymphs Abs: 0.6 10*3/uL — ABNORMAL LOW (ref 0.7–4.0)
MCH: 32.2 pg (ref 26.0–34.0)
MCHC: 34.5 g/dL (ref 30.0–36.0)
MCV: 93.3 fL (ref 80.0–100.0)
Monocytes Absolute: 0.6 10*3/uL (ref 0.1–1.0)
Monocytes Relative: 4 %
Neutro Abs: 14.6 10*3/uL — ABNORMAL HIGH (ref 1.7–7.7)
Neutrophils Relative %: 91 %
Platelets: 313 10*3/uL (ref 150–400)
RBC: 4.5 MIL/uL (ref 3.87–5.11)
RDW: 12.7 % (ref 11.5–15.5)
WBC: 15.9 10*3/uL — ABNORMAL HIGH (ref 4.0–10.5)
nRBC: 0 % (ref 0.0–0.2)

## 2021-12-07 LAB — I-STAT BETA HCG BLOOD, ED (MC, WL, AP ONLY): I-stat hCG, quantitative: <5 m[IU]/mL (ref <5)

## 2021-12-07 MED ORDER — SODIUM CHLORIDE 0.9 % IV BOLUS
1000.0000 mL | Freq: Once | INTRAVENOUS | Status: AC
  Administered 2021-12-07: 1000 mL via INTRAVENOUS

## 2021-12-07 MED ORDER — DROPERIDOL 2.5 MG/ML IJ SOLN
2.5000 mg | Freq: Once | INTRAMUSCULAR | Status: AC
  Administered 2021-12-07: 2.5 mg via INTRAVENOUS
  Filled 2021-12-07: qty 2

## 2021-12-07 MED ORDER — SODIUM CHLORIDE 0.9 % IV SOLN
12.5000 mg | Freq: Four times a day (QID) | INTRAVENOUS | Status: DC | PRN
  Administered 2021-12-07: 12.5 mg via INTRAVENOUS
  Filled 2021-12-07: qty 12.5

## 2021-12-07 NOTE — ED Triage Notes (Signed)
Pt c/o vomiting since 0200 this AM. Pt states she is unsure of what brought the vomiting on. Pt endorses nausea, diarrhea, and abdominal pain.

## 2021-12-07 NOTE — Discharge Instructions (Signed)
We discussed the results of your laboratory testing on today's visit.  We did discuss discontinuing marijuana use, as this is likely causing some of your nausea and vomiting.  Please continue to hydrate with plenty of fluids to help with rehydration.

## 2021-12-07 NOTE — ED Notes (Signed)
Pt verbalizes understanding of discharge instructions. Opportunity for questions and answers were provided. Pt discharged from the ED.   ?

## 2021-12-07 NOTE — ED Provider Notes (Signed)
[facility] EMERGENCY DEPARTMENT Provider Note   CSN: [id number] Arrival date & time: 12/07/21  [id number]     History  No chief complaint on file.   [name] is a 31 y.o. female.  31 year old female with a past medical history of cyclical vomiting presents to the ED with nausea, vomiting, diarrhea that began at 2 AM this morning.  According to her friend at the bedside who is providing most of the history, patient woke up like this, she has been unable to keep any p.o. intake down.  Last menstrual cycle was in the month of February earlier this month and it was regular.  She denies any prior history of drug use.  No suspicious sick contacts.  Also endorsing some diarrhea, no blood in her stool or hematemesis.  Some generalized abdominal cramping.  No fever, no chest pain, no shortness of breath, no vaginal discharge. No Urinary symptoms.  Prior surgical history to her abdomen.     The history is provided by the patient and medical records.      Home Medications Prior to Admission medications   Medication Sig Start Date End Date Taking? Authorizing Provider  Acetaminophen-Codeine 300-30 MG tablet Take 1 tablet by mouth every 4 (four) hours as needed for pain.    [provider]  amoxicillin (AMOXIL) 500 MG tablet Take 500 mg by mouth 3 (three) times daily.    [provider]  cephALEXin (KEFLEX) 500 MG capsule Take 500 mg by mouth 4 (four) times daily.    [provider]  HYDROcodone-acetaminophen (NORCO) 7.5-325 MG tablet Take 1 tablet by mouth every 6 (six) hours as needed for moderate pain.    [provider]  loperamide (IMODIUM) 2 MG capsule Take 1 capsule (2 mg total) by mouth as needed for diarrhea or loose stools. 04/21/19   Jaynee Eagles, PA-C  ondansetron (ZOFRAN-ODT) 8 MG disintegrating tablet Take 1 tablet (8 mg total) by mouth every 8 (eight) hours as needed for nausea or vomiting. 04/21/19   Jaynee Eagles, PA-C   promethazine (PHENERGAN) 25 MG tablet Take 1 tablet (25 mg total) by mouth every 6 (six) hours as needed for nausea or vomiting. 04/22/19   Margarita Mail, PA-C  metoCLOPramide (REGLAN) 10 MG tablet Take 1 tablet (10 mg total) by mouth every 6 (six) hours as needed for nausea. 01/19/19 04/21/19  Julianne Rice, MD  pantoprazole (PROTONIX) 20 MG tablet Take 1 tablet (20 mg total) by mouth daily. 01/19/19 04/21/19  Julianne Rice, MD  potassium chloride SA (K-DUR,KLOR-CON) 20 MEQ tablet Take 1 tablet (20 mEq total) by mouth daily. 01/20/19 04/21/19  Julianne Rice, MD      Allergies    Vancomycin    Review of Systems   Review of Systems  Constitutional:  Negative for fever.  HENT:  Negative for sore throat.   Respiratory:  Negative for shortness of breath.   Cardiovascular:  Negative for chest pain.  Gastrointestinal:  Positive for abdominal pain, diarrhea, nausea and vomiting.  Genitourinary:  Negative for flank pain.  All other systems reviewed and are negative.  Physical Exam Updated Vital Signs BP 140/85    Pulse 84    Temp 98 F (36.7 C) (Oral)    Resp 20    Ht 5' (1.524 m)    Wt 52.2 kg    LMP 11/23/2021    SpO2 94%    BMI 22.46 kg/m  Physical Exam Vitals and nursing note reviewed.  HENT:  Head: Normocephalic and atraumatic.     Mouth/Throat:     Mouth: Mucous membranes are moist.  Cardiovascular:     Rate and Rhythm: Normal rate.  Pulmonary:     Effort: Pulmonary effort is normal.     Breath sounds: No wheezing.  Abdominal:     General: Abdomen is flat. Bowel sounds are decreased.     Tenderness: There is generalized abdominal tenderness.  Musculoskeletal:     Cervical back: Normal range of motion and neck supple.  Skin:    General: Skin is warm and dry.  Neurological:     Mental Status: She is oriented to person, place, and time.    ED Results / Procedures / Treatments   Labs (all labs ordered are listed, but only abnormal results are displayed) Labs Reviewed   CBC WITH DIFFERENTIAL/PLATELET - Abnormal; Notable for the following components:      Result Value   WBC 15.9 (*)    Neutro Abs 14.6 (*)    Lymphs Abs 0.6 (*)    Abs Immature Granulocytes 0.09 (*)    All other components within normal limits  COMPREHENSIVE METABOLIC PANEL - Abnormal; Notable for the following components:   CO2 20 (*)    Glucose, Bld 164 (*)    Total Protein 8.2 (*)    Albumin 5.1 (*)    AST 47 (*)    Total Bilirubin 2.4 (*)    All other components within normal limits  URINALYSIS, ROUTINE W REFLEX MICROSCOPIC - Abnormal; Notable for the following components:   APPearance HAZY (*)    Ketones, ur 20 (*)    Protein, ur 30 (*)    Bacteria, UA RARE (*)    All other components within normal limits  RAPID URINE DRUG SCREEN, HOSP PERFORMED - Abnormal; Notable for the following components:   Tetrahydrocannabinol POSITIVE (*)    All other components within normal limits  LIPASE, BLOOD  ETHANOL  I-STAT BETA HCG BLOOD, ED ([facility], [facility], [facility] ONLY)    EKG EKG Interpretation  Date/Time:  Tuesday December 07 2021 09:57:16 EST Ventricular Rate:  60 PR Interval:  137 QRS Duration: 83 QT Interval:  492 QTC Calculation: 492 R Axis:   80 Text Interpretation: Sinus arrhythmia Borderline prolonged QT interval improved since last tracing No significant change since last tracing Confirmed by Blanchie Dessert [id number]) on 12/07/2021 10:10:12 AM  Radiology No results found.  Procedures Procedures    Medications Ordered in ED Medications  promethazine (PHENERGAN) 12.5 mg in sodium chloride 0.9 % 50 mL IVPB (0 mg Intravenous Stopped 12/07/21 1116)  sodium chloride 0.9 % bolus 1,000 mL (0 mLs Intravenous Stopped 12/07/21 1101)  droperidol (INAPSINE) 2.5 MG/ML injection 2.5 mg (2.5 mg Intravenous Given 12/07/21 1209)    ED Course/ Medical Decision Making/ A&P Clinical Course as of 12/07/21 1455  Tue Dec 07, 2021  1327 Ketones, ur(!): 20 [JS]  1327 Tetrahydrocannabinol(!): POSITIVE  [JS]    Clinical Course User Index [JS] Janeece Fitting, PA-C                           Medical Decision Making Amount and/or Complexity of Data Reviewed Labs: ordered. Decision-making details documented in ED Course.  Risk Prescription drug management.  This patient presents to the ED for concern of nausea and vomiting and diarrhea, this involves a number of treatment options, and is a complaint that carries with it a high risk of complications and morbidity.  The differential diagnosis includes enteritis, cyclical vomiting.    Co morbidities: Discussed in HPI   Brief History:  Patient with a prior history of cyclical vomiting here for nausea vomiting and diarrhea that began around 2 AM.  According to significant other who is providing most of the history patient has not had any prior history of this.  Unable to retain any p.o. medications at this time.  EMR reviewed including pt PMHx, past surgical history and past visits to ER.   See HPI for more details   Lab Tests:  I ordered and independently interpreted labs.  The pertinent results include:    Labs notable for leukocytosis of 15.9, this is likely consistent with her chronic leukocytosis.  Hemoglobin is within normal limits.  CMP without any electrolyte derangement despite continuous vomiting.  No anion gap.  Creatinine levels within normal limits.  LFTs remarkable for a AST to ALT 2:1 ratio.  Lipase level is within normal limits.  We will add ethanol level to screening.   Imaging Studies:  No imaging studies ordered for this patient    Cardiac Monitoring:  The patient was maintained on a cardiac monitor.  I personally viewed and interpreted the cardiac monitored which showed an underlying rhythm of: EKG non-ischemic, EKG obtained to further evaluate QTc prior to given droperidol.  QTc is 492, borderline but will proceed with symptomatic treatment.   Medicines ordered:  I ordered medication including phenergan for  nausea and vomiting.  Along with droperidol to help with nausea and vomiting.  She was also given fluids for rehydration. Reevaluation of the patient after these medicines showed that the patient improved I have reviewed the patients home medicines and have made adjustments as needed  Reevaluation:  After the interventions noted above I re-evaluated patient and found that they have :resolved   Social Determinants of Health:  The patient's social determinants of health were a factor in the care of this patient    Problem List / ED Course:  Patient presents with nausea and vomiting, given some fentanyl to help with pain.  She was also given some Compazine to help with her nausea and vomiting.  After checking EKG with a borderline QTc, she was given droperidol.  Evaluation approximately an hour after receiving medication.  Patient reports improvement.  She is tolerating p.o. adequately without any further episode of vomiting.  UDS is positive for THC, we discussed discontinuing this in order to further episodes of cyclical vomiting.   Dispostion:  After consideration of the diagnostic results and the patients response to treatment, I feel that the patent would benefit from continuing marijuana use.  Continue hydration at home along with follow-up with PCP as needed.   Portions of this note were generated with [employer]. Dictation errors may occur despite best attempts at proofreading.  Final Clinical Impression(s) / ED Diagnoses Final diagnoses:  Nausea and vomiting, unspecified vomiting type    Rx / DC Orders ED Discharge Orders     None         Janeece Fitting, PA-C 12/07/21 1455    Blanchie Dessert, MD 12/10/21 [phone]

## 2023-01-19 DIAGNOSIS — R21 Rash and other nonspecific skin eruption: Secondary | ICD-10-CM | POA: Diagnosis not present

## 2023-03-01 NOTE — Progress Notes (Signed)
Erroneous encounter-disregard

## 2023-03-06 ENCOUNTER — Encounter: Payer: No Typology Code available for payment source | Admitting: Family

## 2023-03-06 DIAGNOSIS — Z7689 Persons encountering health services in other specified circumstances: Secondary | ICD-10-CM

## 2023-04-04 DIAGNOSIS — J029 Acute pharyngitis, unspecified: Secondary | ICD-10-CM | POA: Diagnosis not present

## 2023-04-04 DIAGNOSIS — R0982 Postnasal drip: Secondary | ICD-10-CM | POA: Diagnosis not present

## 2023-10-24 DIAGNOSIS — H5213 Myopia, bilateral: Secondary | ICD-10-CM | POA: Diagnosis not present

## 2024-02-14 ENCOUNTER — Ambulatory Visit: Admission: EM | Admit: 2024-02-14 | Discharge: 2024-02-14 | Disposition: A | Discharge status: Home / Self Care

## 2024-02-14 DIAGNOSIS — M5441 Lumbago with sciatica, right side: Secondary | ICD-10-CM | POA: Diagnosis not present

## 2024-02-14 MED ORDER — PREDNISONE 20 MG PO TABS
40.0000 mg | ORAL_TABLET | Freq: Every day | ORAL | 0 refills | Status: AC
Start: 2024-02-14 — End: 2024-02-19

## 2024-02-14 MED ORDER — BACLOFEN 10 MG PO TABS: 10.0000 mg | ORAL_TABLET | Freq: Three times a day (TID) | ORAL | 0 refills | Status: AC

## 2024-02-14 MED ORDER — KETOROLAC TROMETHAMINE 30 MG/ML IJ SOLN
30.0000 mg | Freq: Once | INTRAMUSCULAR | Status: AC
  Administered 2024-02-14: 30 mg via INTRAMUSCULAR

## 2024-02-14 NOTE — ED Provider Notes (Signed)
 [facility]    CSN: [id number] Arrival date & time: 02/14/24  0806      History   Chief Complaint Chief Complaint  Patient presents with   Back Pain    HPI REACHEL GUTHRIDGE is a 33 y.o. female.   AHUVA MISCHKE is a 33 y.o. female presenting for chief complaint of Back Pain that started 2-3 weeks ago. Pain starts at the right lower back and radiates down the back of the leg into the buttocks and right upper leg intermittently. Pain is sharp, shooting, and "catching". Pain has been tolerable for the last 2-3 weeks but worsened this morning when she was turning in bed. Denies recent and history of trauma/injuries to the low back, heavy lifting at work, previous spinal surgeries. Certain movements make the pain worse and so does walking. No fall, trauma, numbness or tingling, saddle paresthesia, changes to bowel or urinary habits, unilateral extremity weakness. Denies chance of pregnancy. She hasn't noticed if heat improves pain. She has not attempted use of any OTC medications for symptoms prior to arrival. This has never happened before.    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 Authorizing Provider  baclofen (LIORESAL) 10 MG tablet Take 1 tablet (10 mg total) by mouth 3 (three) times daily. 02/14/24  Yes Starlene Eaton, FNP  HYDROcodone -acetaminophen  (NORCO/VICODIN) 5-325  MG tablet  11/09/23  Yes [provider]  ibuprofen  (ADVIL ) 600 MG tablet Take 1,600 mg by mouth every 6 (six) hours as needed. 11/09/23  Yes [provider]  predniSONE (DELTASONE) 20 MG tablet Take 2 tablets (40 mg total) by mouth daily with breakfast for 5 days. 02/14/24 02/19/24 Yes Starlene Eaton, FNP  pseudoephedrine (SUDAFED) 120 MG 12 hr tablet Take 120 mg by mouth. 04/04/23 04/03/24 Yes [provider]  Acetaminophen -Codeine 300-30 MG tablet Take 1 tablet by mouth every 4 (four) hours as needed for pain.    [provider]  amoxicillin (AMOXIL) 500 MG tablet Take 500 mg by mouth 3 (three) times daily.    [provider]  cephALEXin (KEFLEX) 500 MG capsule Take 500 mg by mouth 4 (four) times daily.    [provider]  HYDROcodone -acetaminophen  (NORCO) 7.5-325 MG tablet Take 1 tablet by mouth every 6 (six) hours as needed for moderate pain.    [provider]  loperamide  (IMODIUM ) 2 MG capsule Take 1 capsule (2 mg total) by mouth as needed for diarrhea or loose stools. 04/21/19   Adolph Hoop, PA-C  ondansetron  (ZOFRAN -ODT) 8 MG disintegrating  tablet Take 1 tablet (8 mg total) by mouth every 8 (eight) hours as needed for nausea or vomiting. 04/21/19   Adolph Hoop, PA-C  promethazine  (PHENERGAN ) 25 MG tablet Take 1 tablet (25 mg total) by mouth every 6 (six) hours as needed for nausea or vomiting. 04/22/19   Harris, Abigail, PA-C  metoCLOPramide  (REGLAN ) 10 MG tablet Take 1 tablet (10 mg total) by mouth every 6 (six) hours as needed for nausea. 01/19/19 04/21/19  Evone Hoh, MD  pantoprazole  (PROTONIX ) 20 MG tablet Take 1 tablet (20 mg total) by mouth daily. 01/19/19 04/21/19  Evone Hoh, MD  potassium chloride  SA (K-DUR,KLOR-CON ) 20 MEQ tablet Take 1 tablet (20 mEq total) by mouth daily. 01/20/19 04/21/19  Evone Hoh, MD    Family History Family History  Problem Relation Age of Onset   Hypertension Mother    Healthy Father      Social History Social History   Tobacco Use   Smoking status: Never   Smokeless tobacco: Never  Vaping Use   Vaping status: Never Used  Substance Use Topics   Alcohol use: Not Currently   Drug use: Yes    Types: Marijuana     Allergies   Vancomycin   Review of Systems Review of Systems  Musculoskeletal:  Positive for back pain.  Per HPI  Physical Exam Triage Vital Signs ED Triage Vitals  Encounter Vitals Group     BP 02/14/24 0851 122/77     Systolic BP Percentile --      Diastolic BP Percentile --      Pulse Rate 02/14/24 0851 62     Resp 02/14/24 0851 18     Temp 02/14/24 0851 (!) 97.5 F (36.4 C)     Temp Source 02/14/24 0851 Oral     SpO2 02/14/24 0851 98 %     Weight 02/14/24 0848 130 lb (59 kg)     Height 02/14/24 0848 5' (1.524 m)     Head Circumference --      Peak Flow --      Pain Score 02/14/24 0845 10     Pain Loc --      Pain Education --      Exclude from Growth Chart --    No data found.  Updated Vital Signs BP 122/77 (BP Location: Left Arm)   Pulse 62   Temp (!) 97.5 F (36.4 C) (Oral)   Resp 18   Ht 5' (1.524 m)   Wt 130 lb (59 kg)   LMP 02/09/2024 (Exact Date)   SpO2 98%   BMI 25.39 kg/m   Visual Acuity Right Eye Distance:   Left Eye Distance:   Bilateral Distance:    Right Eye Near:   Left Eye Near:    Bilateral Near:     Physical Exam Vitals and nursing note reviewed.  Constitutional:      Appearance: She is not ill-appearing or toxic-appearing.  HENT:     Head: Normocephalic and atraumatic.     Right Ear: Hearing and external ear normal.     Left Ear: Hearing and external ear normal.     Nose: Nose normal.     Mouth/Throat:     Lips: Pink.  Eyes:     General: Lids are normal. Vision grossly intact. Gaze aligned appropriately.     Extraocular Movements: Extraocular movements intact.     Conjunctiva/sclera: Conjunctivae normal.  Cardiovascular:     Rate and Rhythm: Normal rate and regular rhythm.  Heart  sounds: Normal heart sounds, S1 normal and S2 normal.  Pulmonary:     Effort: Pulmonary effort is normal. No respiratory distress.     Breath sounds: Normal breath sounds and air entry.  Musculoskeletal:     Cervical back: Normal and neck supple.     Thoracic back: Normal.     Lumbar back: Spasms (Right lumbar paraspinous) and tenderness (Significant tenderness with light palpation to the right lumbar paraspinous muscle.) present. No swelling, edema, deformity, signs of trauma, lacerations or bony tenderness. Decreased range of motion (Secondary to pain). Negative right straight leg raise test and negative left straight leg raise test. No scoliosis.     Comments: Tenderness and radicular pain to the right lower extremity at the right thigh elicited with palpation over the right SI joint.  Skin:    General: Skin is warm and dry.     Capillary Refill: Capillary refill takes less than 2 seconds.     Findings: No rash.  Neurological:     General: No focal deficit present.     Mental Status: She is alert and oriented to person, place, and time. Mental status is at baseline.     Cranial Nerves: No dysarthria or facial asymmetry.  Psychiatric:        Mood and Affect: Mood normal.        Speech: Speech normal.        Behavior: Behavior normal.        Thought Content: Thought content normal.        Judgment: Judgment normal.      UC Treatments / Results  Labs (all labs ordered are listed, but only abnormal results are displayed) Labs Reviewed - No data to display  EKG   Radiology No results found.  Procedures Procedures (including critical care time)  Medications Ordered in UC Medications  ketorolac  (TORADOL ) 30 MG/ML injection 30 mg (has no administration in time range)    Initial Impression / Assessment and Plan / UC Course  I have reviewed the triage vital signs and the nursing notes.  Pertinent labs & imaging results that were available during my care of the patient were  reviewed by me and considered in my medical decision making (see chart for details).   1.  Acute right-sided low back pain with right-sided sciatica Evaluation suggests sciatic nerve pain.  No red flag signs/symptoms found on exam indicating need for referral to ED for further workup.  Low suspicion for kidney stone, pyelonephritis, UTI (no symptoms, therefore deferred urine).  Deferred imaging based on atraumatic mechanism of injury.  Given ketorolac  30mg  IM in clinic for acute pain. Pain has not responded well to NSAIDs, therefore will treat with short course of steroid to be started today.  Muscle relaxer as needed for muscular involvement, drowsiness precautions discussed. Follow-up with orthopedics as needed, walking referral given.   Counseled patient on potential for adverse effects with medications prescribed/recommended today, strict ER and return-to-clinic precautions discussed, patient verbalized understanding.    Final Clinical Impressions(s) / UC Diagnoses   Final diagnoses:  Acute right-sided low back pain with right-sided sciatica     Discharge Instructions      Your low back pain is due to sciatic nerve pain. I gave you a shot of ketorolac  in the clinic to help with your pain.  Start taking prednisone once daily for the next 5 days.   Take this with food to avoid stomach upset. Do not take any ibuprofen , Motrin , Aleve,  or other NSAIDs for the next 5 days while taking prednisone. Take muscle relaxer at bedtime as needed for muscle spasm.  No that the muscle relaxer may make you sleepy, so do not take this during the daytime or when drinking/driving.  Apply heat to the low back and use gentle range of motion exercises to prevent stiffness to the area.  Please schedule an appointment for follow-up with your primary care provider or the orthopedic provider listed on your paperwork.      ED Prescriptions     Medication Sig Dispense Auth. Provider   predniSONE  (DELTASONE) 20 MG tablet Take 2 tablets (40 mg total) by mouth daily with breakfast for 5 days. 10 tablet Starlene Eaton, FNP   baclofen (LIORESAL) 10 MG tablet Take 1 tablet (10 mg total) by mouth 3 (three) times daily. 30 each Starlene Eaton, FNP      PDMP not reviewed this encounter.   Shella Devoid [place], [place] 02/14/24 [phone]

## 2024-02-14 NOTE — ED Triage Notes (Signed)
"  I am having back pain right lower (pants line) with radiation down buttocks, leg and up a little on that side in the back". No history of this pain and/or injury (known).

## 2024-02-14 NOTE — Discharge Instructions (Signed)
 Your low back pain is due to sciatic nerve pain. I gave you a shot of ketorolac  in the clinic to help with your pain.  Start taking prednisone once daily for the next 5 days.   Take this with food to avoid stomach upset. Do not take any ibuprofen , Motrin , Aleve, or other NSAIDs for the next 5 days while taking prednisone. Take muscle relaxer at bedtime as needed for muscle spasm.  No that the muscle relaxer may make you sleepy, so do not take this during the daytime or when drinking/driving.  Apply heat to the low back and use gentle range of motion exercises to prevent stiffness to the area.  Please schedule an appointment for follow-up with your primary care provider or the orthopedic provider listed on your paperwork.

## 2024-03-13 DIAGNOSIS — M545 Low back pain, unspecified: Secondary | ICD-10-CM | POA: Diagnosis not present

## 2024-03-13 DIAGNOSIS — M5416 Radiculopathy, lumbar region: Secondary | ICD-10-CM | POA: Diagnosis not present

## 2024-06-08 ENCOUNTER — Ambulatory Visit
Admission: EM | Admit: 2024-06-08 | Discharge: 2024-06-08 | Disposition: A | Discharge status: Home / Self Care | Attending: Emergency Medicine | Admitting: Emergency Medicine

## 2024-06-08 DIAGNOSIS — U071 COVID-19: Secondary | ICD-10-CM

## 2024-06-08 LAB — POC SOFIA SARS ANTIGEN FIA: SARS Coronavirus 2 Ag: POSITIVE — AB

## 2024-06-08 MED ORDER — ONDANSETRON 4 MG PO TBDP
4.0000 mg | ORAL_TABLET | Freq: Once | ORAL | Status: AC
  Administered 2024-06-08: 4 mg via ORAL

## 2024-06-08 MED ORDER — CETIRIZINE HCL 10 MG PO TABS: 10.0000 mg | ORAL_TABLET | Freq: Every day | ORAL | 2 refills | Status: AC

## 2024-06-08 MED ORDER — IBUPROFEN 800 MG PO TABS: 800.0000 mg | ORAL_TABLET | Freq: Three times a day (TID) | ORAL | 0 refills | Status: AC

## 2024-06-08 MED ORDER — ACETAMINOPHEN 325 MG PO TABS
975.0000 mg | ORAL_TABLET | Freq: Once | ORAL | Status: AC
  Administered 2024-06-08: 975 mg via ORAL

## 2024-06-08 MED ORDER — ONDANSETRON 4 MG PO TBDP: 4.0000 mg | ORAL_TABLET | Freq: Four times a day (QID) | ORAL | 0 refills | Status: AC | PRN

## 2024-06-08 NOTE — ED Triage Notes (Addendum)
 Sx x 2 days  Sore throat Back pain Body aches Headache   Hasn't taken anything

## 2024-06-08 NOTE — Discharge Instructions (Addendum)
 The zofran  can be used every 6 hours as needed to settle the stomach Alternate ibuprofen  and tylenol  for aches, fever Drink lots of fluids Zyrtec  once daily for runny nose You can start mucinex if you develop further congestion  Symptoms may persist up to a week Please return if needed

## 2024-06-08 NOTE — ED Provider Notes (Addendum)
 [facility]    CSN: [id number] Arrival date & time: 06/08/24  0801      History   Chief Complaint Chief Complaint  Patient presents with   Back Pain   Sore Throat    HPI [name] is a 33 y.o. female.  Here with 2 day history of sore throat, body aches, headache No known fever but felt hot last night  Currently nauseous. 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 Authorizing Provider  cetirizine  (ZYRTEC  ALLERGY) 10 MG tablet Take 1 tablet (10 mg total) by mouth daily. 06/08/24  Yes Elaine Middleton, Asberry, PA-C  ibuprofen  (ADVIL ) 800 MG tablet Take 1 tablet (800 mg total) by mouth 3 (three) times daily. 06/08/24  Yes Satchel Heidinger, Asberry, PA-C  ondansetron  (ZOFRAN -ODT) 4 MG disintegrating tablet Take 1 tablet (4 mg total) by mouth every 6 (six) hours as needed for nausea or vomiting. 06/08/24  Yes Alyssabeth Bruster, Asberry, PA-C  Acetaminophen -Codeine 300-30 MG tablet Take 1 tablet by mouth every 4 (four) hours as needed for pain.    [provider]  baclofen  (LIORESAL ) 10 MG tablet Take 1 tablet (10 mg total) by mouth 3 (three) times daily. 02/14/24   Enedelia Dorna HERO, FNP  metoCLOPramide  (REGLAN ) 10 MG tablet Take 1 tablet (10 mg total) by mouth every 6 (six) hours as needed for nausea. 01/19/19 04/21/19   Carlyle Lenis, MD  pantoprazole  (PROTONIX ) 20 MG tablet Take 1 tablet (20 mg total) by mouth daily. 01/19/19 04/21/19  Carlyle Lenis, MD  potassium chloride  SA (K-DUR,KLOR-CON ) 20 MEQ tablet Take 1 tablet (20 mEq total) by mouth daily. 01/20/19 04/21/19  Carlyle Lenis, MD    Family History Family History  Problem Relation Age of Onset   Hypertension Mother    Healthy Father     Social History Social History   Tobacco Use   Smoking status: Never   Smokeless tobacco: Never  Vaping Use   Vaping status: Never Used  Substance Use Topics   Alcohol use: Not Currently   Drug use: Yes    Types: Marijuana     Allergies   Vancomycin   Review of Systems Review of Systems As per HPI  Physical Exam Triage Vital Signs ED Triage Vitals [06/08/24 0823]  Encounter Vitals Group     BP      Girls Systolic BP Percentile      Girls Diastolic BP Percentile      Boys Systolic BP Percentile  Boys Diastolic BP Percentile      Pulse      Resp      Temp      Temp src      SpO2      Weight      Height      Head Circumference      Peak Flow      Pain Score 10     Pain Loc      Pain Education      Exclude from Growth Chart    No data found.  Updated Vital Signs BP 124/78 (BP Location: Right Arm)   Pulse (!) 104   Temp 98.7 F (37.1 C) (Oral)   Resp 20   LMP 05/16/2024 (Exact Date)   SpO2 98%    Physical Exam Vitals and nursing note reviewed.  HENT:     Nose: Rhinorrhea present.     Mouth/Throat:     Mouth: Mucous membranes are moist.     Pharynx: Oropharynx is clear. No oropharyngeal exudate or posterior oropharyngeal erythema.  Eyes:     Conjunctiva/sclera: Conjunctivae normal.  Cardiovascular:     Rate and Rhythm: Normal rate and regular rhythm.     Pulses: Normal pulses.     Heart sounds: Normal heart sounds.  Pulmonary:     Effort: Pulmonary effort is normal.     Breath sounds: Normal breath sounds.  Musculoskeletal:     Cervical back: Normal range of  motion.  Lymphadenopathy:     Cervical: No cervical adenopathy.  Skin:    General: Skin is warm and dry.  Neurological:     Mental Status: She is alert and oriented to person, place, and time.      UC Treatments / Results  Labs (all labs ordered are listed, but only abnormal results are displayed) Labs Reviewed  POC SOFIA SARS ANTIGEN FIA - Abnormal; Notable for the following components:      Result Value   SARS Coronavirus 2 Ag Positive (*)    All other components within normal limits    EKG  Radiology No results found.  Procedures Procedures   Medications Ordered in UC Medications  ondansetron  (ZOFRAN -ODT) disintegrating tablet 4 mg (4 mg Oral Given 06/08/24 0907)  acetaminophen  (TYLENOL ) tablet 975 mg (975 mg Oral Given 06/08/24 0907)    Initial Impression / Assessment and Plan / UC Course  I have reviewed the triage vital signs and the nursing notes.  Pertinent labs & imaging results that were available during my care of the patient were reviewed by me and considered in my medical decision making (see chart for details).  Covid positive  Zofran  ODT given in clinic with tylenol  dose PO challenge successful  Discussed supportive care for viral etiology Sent zofran , ibuprofen , cetirizine  Note for work provided Return precautions   Final Clinical Impressions(s) / UC Diagnoses   Final diagnoses:  COVID-19     Discharge Instructions      The zofran  can be used every 6 hours as needed to settle the stomach Alternate ibuprofen  and tylenol  for aches, fever Drink lots of fluids Zyrtec  once daily for runny nose You can start mucinex if you develop further congestion  Symptoms may persist up to a week Please return if needed     ED Prescriptions     Medication Sig Dispense Auth. Provider   ibuprofen  (ADVIL ) 800 MG tablet Take 1 tablet (800 mg total) by mouth 3 (three) times daily. 21 tablet Cass Edinger, Asberry, PA-C  cetirizine  (ZYRTEC  ALLERGY) 10 MG  tablet Take 1 tablet (10 mg total) by mouth daily. 30 tablet Glenwood Revoir, PA-C   ondansetron  (ZOFRAN -ODT) 4 MG disintegrating tablet Take 1 tablet (4 mg total) by mouth every 6 (six) hours as needed for nausea or vomiting. 20 tablet Avagrace Botelho, Asberry, PA-C      I have reviewed the PDMP during this encounter.       Jeryl Asberry, PA-C 06/08/24 [phone]

## 2024-08-25 ENCOUNTER — Emergency Department (HOSPITAL_COMMUNITY)

## 2024-08-25 ENCOUNTER — Other Ambulatory Visit: Payer: Self-pay

## 2024-08-25 ENCOUNTER — Encounter (HOSPITAL_COMMUNITY): Payer: Self-pay | Admitting: *Deleted

## 2024-08-25 ENCOUNTER — Emergency Department (HOSPITAL_COMMUNITY)
Admission: EM | Admit: 2024-08-25 | Discharge: 2024-08-25 | Disposition: A | Discharge status: Home / Self Care | Attending: Emergency Medicine | Admitting: Emergency Medicine

## 2024-08-25 DIAGNOSIS — K529 Noninfective gastroenteritis and colitis, unspecified: Secondary | ICD-10-CM | POA: Diagnosis not present

## 2024-08-25 DIAGNOSIS — R109 Unspecified abdominal pain: Secondary | ICD-10-CM | POA: Diagnosis not present

## 2024-08-25 DIAGNOSIS — R9431 Abnormal electrocardiogram [ECG] [EKG]: Secondary | ICD-10-CM | POA: Diagnosis not present

## 2024-08-25 DIAGNOSIS — R112 Nausea with vomiting, unspecified: Secondary | ICD-10-CM | POA: Diagnosis present

## 2024-08-25 LAB — URINALYSIS, ROUTINE W REFLEX MICROSCOPIC
Bacteria, UA: NONE SEEN
Bilirubin Urine: NEGATIVE
Glucose, UA: NEGATIVE mg/dL
Ketones, ur: 5 mg/dL — AB
Nitrite: NEGATIVE
Protein, ur: 100 mg/dL — AB
RBC / HPF: >50 RBC/hpf (ref 0–5)
Specific Gravity, Urine: 1.021 (ref 1.005–1.030)
pH: 8 (ref 5.0–8.0)

## 2024-08-25 LAB — COMPREHENSIVE METABOLIC PANEL WITH GFR
ALT: 14 U/L (ref 0–44)
AST: 28 U/L (ref 15–41)
Albumin: 4.9 g/dL (ref 3.5–5.0)
Alkaline Phosphatase: 75 U/L (ref 38–126)
Anion gap: 16 — ABNORMAL HIGH (ref 5–15)
BUN: 15 mg/dL (ref 6–20)
CO2: 19 mmol/L — ABNORMAL LOW (ref 22–32)
Calcium: 10.4 mg/dL — ABNORMAL HIGH (ref 8.9–10.3)
Chloride: 106 mmol/L (ref 98–111)
Creatinine, Ser: 0.88 mg/dL (ref 0.44–1.00)
GFR, Estimated: >60 mL/min (ref >60)
Glucose, Bld: 140 mg/dL — ABNORMAL HIGH (ref 70–99)
Potassium: 3.7 mmol/L (ref 3.5–5.1)
Sodium: 141 mmol/L (ref 135–145)
Total Bilirubin: 1.2 mg/dL (ref 0.0–1.2)
Total Protein: 8.2 g/dL — ABNORMAL HIGH (ref 6.5–8.1)

## 2024-08-25 LAB — CBC WITH DIFFERENTIAL/PLATELET
Abs Immature Granulocytes: 0.04 K/uL (ref 0.00–0.07)
Basophils Absolute: 0 K/uL (ref 0.0–0.1)
Basophils Relative: 0 %
Eosinophils Absolute: 0 K/uL (ref 0.0–0.5)
Eosinophils Relative: 0 %
HCT: 40.4 % (ref 36.0–46.0)
Hemoglobin: 13 g/dL (ref 12.0–15.0)
Immature Granulocytes: 0 %
Lymphocytes Relative: 7 %
Lymphs Abs: 1 K/uL (ref 0.7–4.0)
MCH: 29.9 pg (ref 26.0–34.0)
MCHC: 32.2 g/dL (ref 30.0–36.0)
MCV: 92.9 fL (ref 80.0–100.0)
Monocytes Absolute: 0.4 K/uL (ref 0.1–1.0)
Monocytes Relative: 3 %
Neutro Abs: 13.5 K/uL — ABNORMAL HIGH (ref 1.7–7.7)
Neutrophils Relative %: 90 %
Platelets: 340 K/uL (ref 150–400)
RBC: 4.35 MIL/uL (ref 3.87–5.11)
RDW: 12.9 % (ref 11.5–15.5)
WBC: 15 K/uL — ABNORMAL HIGH (ref 4.0–10.5)
nRBC: 0 % (ref 0.0–0.2)

## 2024-08-25 LAB — I-STAT CHEM 8, ED
BUN: 15 mg/dL (ref 6–20)
Calcium, Ion: 1.16 mmol/L (ref 1.15–1.40)
Chloride: 110 mmol/L (ref 98–111)
Creatinine, Ser: 0.8 mg/dL (ref 0.44–1.00)
Glucose, Bld: 143 mg/dL — ABNORMAL HIGH (ref 70–99)
HCT: 39 % (ref 36.0–46.0)
Hemoglobin: 13.3 g/dL (ref 12.0–15.0)
Potassium: 3.4 mmol/L — ABNORMAL LOW (ref 3.5–5.1)
Sodium: 140 mmol/L (ref 135–145)
TCO2: 18 mmol/L — ABNORMAL LOW (ref 22–32)

## 2024-08-25 LAB — HCG, QUANTITATIVE, PREGNANCY: hCG, Beta Chain, Quant, S: <1 m[IU]/mL (ref <5)

## 2024-08-25 LAB — TROPONIN T, HIGH SENSITIVITY: Troponin T High Sensitivity: <15 ng/L (ref 0–19)

## 2024-08-25 LAB — PREGNANCY, URINE: Preg Test, Ur: NEGATIVE

## 2024-08-25 LAB — LIPASE, BLOOD: Lipase: 17 U/L (ref 11–51)

## 2024-08-25 MED ORDER — PROCHLORPERAZINE MALEATE 10 MG PO TABS: 10.0000 mg | ORAL_TABLET | Freq: Three times a day (TID) | ORAL | 0 refills | Status: AC | PRN

## 2024-08-25 MED ORDER — PROCHLORPERAZINE EDISYLATE 10 MG/2ML IJ SOLN
10.0000 mg | Freq: Once | INTRAMUSCULAR | Status: AC
  Administered 2024-08-25: 10 mg via INTRAVENOUS
  Filled 2024-08-25: qty 2

## 2024-08-25 MED ORDER — IOHEXOL 300 MG/ML  SOLN
100.0000 mL | Freq: Once | INTRAMUSCULAR | Status: AC | PRN
  Administered 2024-08-25: 100 mL via INTRAVENOUS

## 2024-08-25 MED ORDER — SODIUM CHLORIDE 0.9 % IV BOLUS
1000.0000 mL | Freq: Once | INTRAVENOUS | Status: AC
  Administered 2024-08-25: 1000 mL via INTRAVENOUS

## 2024-08-25 MED ORDER — DIPHENHYDRAMINE HCL 50 MG/ML IJ SOLN
25.0000 mg | Freq: Once | INTRAMUSCULAR | Status: AC
  Administered 2024-08-25: 25 mg via INTRAVENOUS
  Filled 2024-08-25: qty 1

## 2024-08-25 MED ORDER — ONDANSETRON HCL 4 MG/2ML IJ SOLN
4.0000 mg | Freq: Once | INTRAMUSCULAR | Status: AC
  Administered 2024-08-25: 4 mg via INTRAVENOUS
  Filled 2024-08-25: qty 2

## 2024-08-25 MED ORDER — KETOROLAC TROMETHAMINE 30 MG/ML IJ SOLN: 30.0000 mg | Freq: Once | INTRAMUSCULAR | Status: DC

## 2024-08-25 NOTE — ED Provider Notes (Signed)
 [place] EMERGENCY DEPARTMENT AT [facility] Provider Note   CSN: [id number] Arrival date & time: 08/25/24  1744     Patient presents with: Emesis   [name] is a 33 y.o. female otherwise healthy here presenting with abdominal pain and chills.  Patient states that she just started her menstrual cycle yesterday.  She states that she had several pads yesterday.  She then had lower abdominal cramps.  Patient states that she felt hot and cold today.  She states that she is also has been vomiting and unable to keep anything down.   The history is provided by the patient.       Prior to Admission medications   Medication Sig Start Date End Date Taking? Authorizing Provider  Acetaminophen -Codeine 300-30 MG tablet Take 1 tablet by mouth every 4 (four) hours as needed for pain.    [provider]  baclofen  (LIORESAL ) 10 MG tablet Take 1 tablet (10 mg total) by mouth 3 (three) times daily. 02/14/24   Enedelia Dorna HERO, FNP  cetirizine  (ZYRTEC  ALLERGY) 10 MG tablet Take 1 tablet (10 mg total) by mouth daily. 06/08/24   Rising, Asberry, PA-C  ibuprofen  (ADVIL ) 800 MG tablet Take 1 tablet (800 mg total) by mouth 3 (three) times daily. 06/08/24   Rising, Asberry, PA-C  ondansetron  (ZOFRAN -ODT) 4 MG disintegrating tablet Take 1 tablet (4 mg total) by mouth every 6 (six) hours as needed for nausea or vomiting. 06/08/24   Rising, Asberry, PA-C  metoCLOPramide  (REGLAN ) 10 MG tablet Take 1 tablet (10 mg total) by mouth every 6 (six) hours as needed for nausea. 01/19/19 04/21/19  Carlyle Lenis, MD  pantoprazole  (PROTONIX ) 20 MG tablet Take 1 tablet (20 mg total) by mouth daily. 01/19/19 04/21/19  Carlyle Lenis, MD  potassium chloride  SA (K-DUR,KLOR-CON ) 20 MEQ tablet Take 1 tablet (20 mEq total) by mouth daily. 01/20/19 04/21/19  Carlyle Lenis, MD    Allergies: Vancomycin    Review of Systems  Constitutional:  Positive for chills.  Gastrointestinal:  Positive for vomiting.   All other systems reviewed and are negative.   Updated Vital Signs BP 127/85   Pulse 73   Temp 98.4 F (36.9 C)   Resp 19   Wt 62.6 kg   LMP 08/25/2024 (Exact Date)   SpO2 100%   BMI 26.95 kg/m   Physical Exam Vitals and nursing note reviewed.  Constitutional:      Comments: Slightly dehydrated  HENT:     Head: Normocephalic.     Nose: Nose normal.     Mouth/Throat:     Mouth: Mucous membranes are dry.  Eyes:     Extraocular Movements: Extraocular movements intact.     Pupils: Pupils are equal, round, and reactive to light.  Cardiovascular:     Rate and Rhythm: Normal rate and regular rhythm.     Pulses: Normal pulses.     Heart sounds: Normal heart sounds.  Pulmonary:     Effort: Pulmonary effort is normal.     Breath sounds: Normal breath sounds.  Abdominal:     General: Abdomen is flat.     Comments: Mild lower abdominal tenderness.  No rebound or guarding  Musculoskeletal:        General: Normal range of motion.     Cervical back: Normal range of motion and neck supple.  Skin:    General: Skin is warm.     Capillary Refill: Capillary refill takes less than 2 seconds.  Neurological:  General: No focal deficit present.     Mental Status: She is oriented to person, place, and time.  Psychiatric:        Mood and Affect: Mood normal.        Behavior: Behavior normal.     (all labs ordered are listed, but only abnormal results are displayed) Labs Reviewed  CBC WITH DIFFERENTIAL/PLATELET - Abnormal; Notable for the following components:      Result Value   WBC 15.0 (*)    Neutro Abs 13.5 (*)    All other components within normal limits  I-STAT CHEM 8, ED - Abnormal; Notable for the following components:   Potassium 3.4 (*)    Glucose, Bld 143 (*)    TCO2 18 (*)    All other components within normal limits  COMPREHENSIVE METABOLIC PANEL WITH GFR  LIPASE, BLOOD  URINALYSIS, ROUTINE W REFLEX MICROSCOPIC  HCG, QUANTITATIVE, PREGNANCY  PREGNANCY, URINE   TROPONIN T, HIGH SENSITIVITY    EKG: EKG Interpretation Date/Time:  Sunday August 25 2024 18:15:24 EST Ventricular Rate:  76 PR Interval:  130 QRS Duration:  81 QT Interval:  444 QTC Calculation: 500 R Axis:   79  Text Interpretation: Sinus rhythm Prolonged QT interval No significant change since last tracing Confirmed by Patt Alm DEL [phone]) on 08/25/2024 6:20:32 PM  Radiology: No results found.   Procedures   Medications Ordered in the ED  sodium chloride  0.9 % bolus 1,000 mL (1,000 mLs Intravenous New Bag/Given 08/25/24 1832)  ondansetron  (ZOFRAN ) injection 4 mg (4 mg Intravenous Given 08/25/24 1827)                                    Medical Decision Making [name] is a 33 y.o. female here presenting with nausea vomiting abdominal pain.  Consider gastroenteritis versus menstrual cramps versus pyelonephritis.  Plan to get CBC CMP and UA and CT abdomen pelvis.  Will hydrate and reassess  9:57 PM With blood cell count is 15.  Anion gap is 16.  Patient given IV fluids and felt better.  CT abdomen pelvis unremarkable.  Feeling better now.  Likely viral gastroenteritis versus cramps from menstrual period. Stable for discharge   Problems Addressed: Gastroenteritis: acute illness or injury  Amount and/or Complexity of Data Reviewed Labs: ordered. Decision-making details documented in ED Course. Radiology: ordered and independent interpretation performed. Decision-making details documented in ED Course. ECG/medicine tests: ordered.  Risk Prescription drug management.    Final diagnoses:  None    ED Discharge Orders     None          Patt Alm Macho, MD 08/25/24 2202

## 2024-08-25 NOTE — ED Triage Notes (Signed)
 BIB family from home for NV, abd pain. Onset this am. Relates sx to menstrual cycle, and abd pain to vomiting. LMP today. Endorses hot and cold sweats. Denies fever or diarrhea. Pt alert, NAD, calm, interactive, resps e/u, speaking in clear complete sentences, skin W&D. Steady gait.

## 2024-08-25 NOTE — Discharge Instructions (Signed)
 As we discussed, you likely have a stomach virus or menstrual cramps  Take Compazine as needed for nausea  Please stay hydrated  See your doctor for follow-up  Return to ER if you have worse abdominal pain or vomiting or dehydration
